# Patient Record
Sex: Male | Born: 1950 | Race: White | Hispanic: No | Marital: Married | Smoking: Former smoker
Health system: Southern US, Community
[De-identification: ages and names within clinical notes are randomized; demographics above are authoritative.]

## PROBLEM LIST (undated history)

## (undated) DIAGNOSIS — E782 Mixed hyperlipidemia: Secondary | ICD-10-CM

## (undated) DIAGNOSIS — I10 Essential (primary) hypertension: Secondary | ICD-10-CM

## (undated) DIAGNOSIS — N135 Crossing vessel and stricture of ureter without hydronephrosis: Secondary | ICD-10-CM

## (undated) DIAGNOSIS — I2510 Atherosclerotic heart disease of native coronary artery without angina pectoris: Secondary | ICD-10-CM

## (undated) DIAGNOSIS — E119 Type 2 diabetes mellitus without complications: Secondary | ICD-10-CM

## (undated) DIAGNOSIS — C61 Malignant neoplasm of prostate: Secondary | ICD-10-CM

## (undated) HISTORY — DX: Malignant neoplasm of prostate: C61

## (undated) HISTORY — PX: CARDIOVASCULAR STRESS TEST: SHX262

## (undated) HISTORY — DX: Mixed hyperlipidemia: E78.2

## (undated) HISTORY — DX: Atherosclerotic heart disease of native coronary artery without angina pectoris: I25.10

## (undated) HISTORY — DX: Type 2 diabetes mellitus without complications: E11.9

## (undated) HISTORY — PX: CARDIAC CATHETERIZATION: SHX172

---
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
## 2006-08-08 ENCOUNTER — Encounter (INDEPENDENT_AMBULATORY_CARE_PROVIDER_SITE_OTHER): Payer: Self-pay | Admitting: Internal Medicine

## 2006-08-08 ENCOUNTER — Ambulatory Visit: Payer: Self-pay | Admitting: Family Medicine

## 2006-08-08 DIAGNOSIS — R209 Unspecified disturbances of skin sensation: Secondary | ICD-10-CM | POA: Insufficient documentation

## 2006-08-18 ENCOUNTER — Encounter (INDEPENDENT_AMBULATORY_CARE_PROVIDER_SITE_OTHER): Payer: Self-pay | Admitting: Internal Medicine

## 2006-10-03 DIAGNOSIS — G5600 Carpal tunnel syndrome, unspecified upper limb: Secondary | ICD-10-CM | POA: Insufficient documentation

## 2006-10-12 ENCOUNTER — Encounter (INDEPENDENT_AMBULATORY_CARE_PROVIDER_SITE_OTHER): Payer: Self-pay | Admitting: Internal Medicine

## 2006-10-12 HISTORY — PX: CARPAL TUNNEL RELEASE: SHX101

## 2007-12-12 ENCOUNTER — Ambulatory Visit: Payer: Self-pay | Admitting: Family Medicine

## 2007-12-12 DIAGNOSIS — I10 Essential (primary) hypertension: Secondary | ICD-10-CM | POA: Insufficient documentation

## 2007-12-14 LAB — CONVERTED CEMR LAB
ALT: 33 units/L (ref 0–53)
AST: 22 units/L (ref 0–37)
Basophils Relative: 0.7 % (ref 0.0–3.0)
CO2: 29 meq/L (ref 19–32)
Calcium: 9.3 mg/dL (ref 8.4–10.5)
Chloride: 105 meq/L (ref 96–112)
Creatinine, Ser: 1.1 mg/dL (ref 0.4–1.5)
Direct LDL: 174.6 mg/dL
Eosinophils Relative: 4.8 % (ref 0.0–5.0)
Glucose, Bld: 127 mg/dL — ABNORMAL HIGH (ref 70–99)
HDL: 27.4 mg/dL — ABNORMAL LOW (ref >39.0)
Hemoglobin: 16.6 g/dL (ref 13.0–17.0)
Hgb A1c MFr Bld: 6.3 % — ABNORMAL HIGH (ref 4.6–6.0)
Lymphocytes Relative: 27.7 % (ref 12.0–46.0)
Monocytes Relative: 9.3 % (ref 3.0–12.0)
Neutro Abs: 3.9 10*3/uL (ref 1.4–7.7)
Neutrophils Relative %: 57.5 % (ref 43.0–77.0)
RBC: 5.37 M/uL (ref 4.22–5.81)
Total Bilirubin: 0.8 mg/dL (ref 0.3–1.2)
Total CHOL/HDL Ratio: 10.1
Total Protein: 6.4 g/dL (ref 6.0–8.3)
VLDL: 47 mg/dL — ABNORMAL HIGH (ref 0–40)
WBC: 6.7 10*3/uL (ref 4.5–10.5)

## 2007-12-15 ENCOUNTER — Ambulatory Visit: Payer: Self-pay | Admitting: Family Medicine

## 2007-12-15 DIAGNOSIS — E119 Type 2 diabetes mellitus without complications: Secondary | ICD-10-CM | POA: Insufficient documentation

## 2007-12-15 DIAGNOSIS — E782 Mixed hyperlipidemia: Secondary | ICD-10-CM | POA: Insufficient documentation

## 2007-12-15 DIAGNOSIS — C61 Malignant neoplasm of prostate: Secondary | ICD-10-CM

## 2008-01-29 ENCOUNTER — Ambulatory Visit: Payer: Self-pay | Admitting: Internal Medicine

## 2008-01-31 ENCOUNTER — Telehealth (INDEPENDENT_AMBULATORY_CARE_PROVIDER_SITE_OTHER): Payer: Self-pay | Admitting: Internal Medicine

## 2008-02-01 LAB — CONVERTED CEMR LAB
AST: 27 units/L (ref 0–37)
Cholesterol: 213 mg/dL (ref 0–200)
Direct LDL: 127 mg/dL
PSA: 9.65 ng/mL — ABNORMAL HIGH (ref 0.10–4.00)
Total CHOL/HDL Ratio: 5.2
Triglycerides: 205 mg/dL (ref 0–149)
VLDL: 41 mg/dL — ABNORMAL HIGH (ref 0–40)

## 2008-02-06 ENCOUNTER — Ambulatory Visit: Payer: Self-pay | Admitting: Family Medicine

## 2008-03-05 ENCOUNTER — Ambulatory Visit: Payer: Self-pay | Admitting: Family Medicine

## 2008-03-06 ENCOUNTER — Encounter (INDEPENDENT_AMBULATORY_CARE_PROVIDER_SITE_OTHER): Payer: Self-pay | Admitting: Internal Medicine

## 2008-03-07 LAB — CONVERTED CEMR LAB
Creatinine,U: 67.8 mg/dL
HDL: 38 mg/dL — ABNORMAL LOW (ref >39.0)
Microalb, Ur: 0.5 mg/dL (ref 0.0–1.9)
Total CHOL/HDL Ratio: 4.4
VLDL: 18 mg/dL (ref 0–40)

## 2008-03-14 ENCOUNTER — Encounter (INDEPENDENT_AMBULATORY_CARE_PROVIDER_SITE_OTHER): Payer: Self-pay | Admitting: Internal Medicine

## 2008-04-25 ENCOUNTER — Ambulatory Visit: Payer: Self-pay | Admitting: Family Medicine

## 2008-04-25 LAB — CONVERTED CEMR LAB
ALT: 34 units/L (ref 0–53)
HDL: 37.1 mg/dL — ABNORMAL LOW (ref >39.00)
LDL Cholesterol: 130 mg/dL — ABNORMAL HIGH (ref 0–99)
Total CHOL/HDL Ratio: 5
Triglycerides: 85 mg/dL (ref 0.0–149.0)

## 2008-04-26 ENCOUNTER — Telehealth (INDEPENDENT_AMBULATORY_CARE_PROVIDER_SITE_OTHER): Payer: Self-pay | Admitting: *Deleted

## 2008-05-09 ENCOUNTER — Ambulatory Visit: Payer: Self-pay | Admitting: Family Medicine

## 2008-05-10 LAB — CONVERTED CEMR LAB
LDL Cholesterol: 131 mg/dL — ABNORMAL HIGH (ref 0–99)
Total CHOL/HDL Ratio: 5

## 2008-06-05 ENCOUNTER — Ambulatory Visit: Payer: Self-pay | Admitting: Family Medicine

## 2008-06-05 DIAGNOSIS — L723 Sebaceous cyst: Secondary | ICD-10-CM

## 2008-06-06 ENCOUNTER — Ambulatory Visit: Payer: Self-pay | Admitting: Family Medicine

## 2008-06-10 ENCOUNTER — Ambulatory Visit: Payer: Self-pay | Admitting: Family Medicine

## 2008-08-06 ENCOUNTER — Ambulatory Visit: Payer: Self-pay | Admitting: Family Medicine

## 2008-08-07 LAB — CONVERTED CEMR LAB
ALT: 37 units/L (ref 0–53)
Cholesterol: 194 mg/dL (ref 0–200)
HDL: 34.3 mg/dL — ABNORMAL LOW (ref >39.00)
Triglycerides: 115 mg/dL (ref 0.0–149.0)
VLDL: 23 mg/dL (ref 0.0–40.0)

## 2008-09-04 ENCOUNTER — Ambulatory Visit: Payer: Self-pay | Admitting: Family Medicine

## 2008-09-04 LAB — HM DIABETES FOOT EXAM

## 2008-10-08 ENCOUNTER — Ambulatory Visit: Payer: Self-pay | Admitting: Family Medicine

## 2008-10-11 ENCOUNTER — Ambulatory Visit: Payer: Self-pay | Admitting: Family Medicine

## 2008-10-17 LAB — CONVERTED CEMR LAB
HDL: 38.7 mg/dL — ABNORMAL LOW (ref >39.00)
Hgb A1c MFr Bld: 6.2 % (ref 4.6–6.5)
LDL Cholesterol: 119 mg/dL — ABNORMAL HIGH (ref 0–99)
Total CHOL/HDL Ratio: 5
Triglycerides: 197 mg/dL — ABNORMAL HIGH (ref 0.0–149.0)
VLDL: 39.4 mg/dL (ref 0.0–40.0)

## 2010-02-01 DIAGNOSIS — C61 Malignant neoplasm of prostate: Secondary | ICD-10-CM

## 2010-02-01 HISTORY — DX: Malignant neoplasm of prostate: C61

## 2010-12-14 ENCOUNTER — Encounter: Payer: Self-pay | Admitting: Family Medicine

## 2010-12-15 ENCOUNTER — Ambulatory Visit (INDEPENDENT_AMBULATORY_CARE_PROVIDER_SITE_OTHER)
Admission: RE | Admit: 2010-12-15 | Discharge: 2010-12-15 | Disposition: A | Discharge status: Home / Self Care | Payer: Managed Care, Other (non HMO) | Source: Ambulatory Visit | Attending: Family Medicine | Admitting: Family Medicine

## 2010-12-15 ENCOUNTER — Ambulatory Visit (INDEPENDENT_AMBULATORY_CARE_PROVIDER_SITE_OTHER): Payer: Managed Care, Other (non HMO) | Admitting: Family Medicine

## 2010-12-15 ENCOUNTER — Encounter: Payer: Self-pay | Admitting: Family Medicine

## 2010-12-15 VITALS — BP 166/90 | HR 76 | Temp 98.2°F | Ht 72.0 in | Wt 207.0 lb

## 2010-12-15 DIAGNOSIS — R972 Elevated prostate specific antigen [PSA]: Secondary | ICD-10-CM

## 2010-12-15 DIAGNOSIS — M545 Low back pain, unspecified: Secondary | ICD-10-CM

## 2010-12-15 DIAGNOSIS — R7309 Other abnormal glucose: Secondary | ICD-10-CM

## 2010-12-15 DIAGNOSIS — E782 Mixed hyperlipidemia: Secondary | ICD-10-CM

## 2010-12-15 DIAGNOSIS — M5136 Other intervertebral disc degeneration, lumbar region: Secondary | ICD-10-CM | POA: Insufficient documentation

## 2010-12-15 DIAGNOSIS — I10 Essential (primary) hypertension: Secondary | ICD-10-CM

## 2010-12-15 DIAGNOSIS — R7303 Prediabetes: Secondary | ICD-10-CM

## 2010-12-15 LAB — COMPREHENSIVE METABOLIC PANEL
ALT: 21 U/L (ref 0–53)
AST: 21 U/L (ref 0–37)
CO2: 32 mEq/L (ref 19–32)
Calcium: 9.2 mg/dL (ref 8.4–10.5)
Chloride: 102 mEq/L (ref 96–112)
Creatinine, Ser: 1.6 mg/dL — ABNORMAL HIGH (ref 0.4–1.5)
GFR: 46.35 mL/min — ABNORMAL LOW (ref >60.00)
Sodium: 140 mEq/L (ref 135–145)
Total Bilirubin: 0.8 mg/dL (ref 0.3–1.2)
Total Protein: 6.5 g/dL (ref 6.0–8.3)

## 2010-12-15 LAB — HEMOGLOBIN A1C: Hgb A1c MFr Bld: 6.5 % (ref 4.6–6.5)

## 2010-12-15 LAB — LIPID PANEL: Total CHOL/HDL Ratio: 7

## 2010-12-15 MED ORDER — CYCLOBENZAPRINE HCL 5 MG PO TABS: 5.0000 mg | ORAL_TABLET | Freq: Three times a day (TID) | ORAL | Status: DC | PRN

## 2010-12-15 MED ORDER — LISINOPRIL 20 MG PO TABS: 20.0000 mg | ORAL_TABLET | Freq: Every day | ORAL | Status: DC

## 2010-12-15 MED ORDER — NAPROXEN 500 MG PO TABS: ORAL_TABLET | ORAL | Status: DC

## 2010-12-15 NOTE — Assessment & Plan Note (Signed)
actually prediabetes based on previous blood work. Recheck A1c today.

## 2010-12-15 NOTE — Patient Instructions (Addendum)
Xray today.   For back - sounds like lumbar strain.  Treat with naprosyn twice daily with food for 1 week then as needed.  May use flexeril as needed for muscle spasm (may make you sleepy) I have checked blood work today.  (prostate, kidneys, A1c for diabetes, cholesterol). Restart lisinopril for blood pressure. Return at your convenience for physical.

## 2010-12-15 NOTE — Assessment & Plan Note (Signed)
concerning in setting of recent weight loss (although small amt). Discussed improtance of further evaluation of this abnormal elevation. Pt continues to decline prostate biopsy, aware cancer may be causing elevation.  Would like to start with PSA check today. Advised to return for physical. Wt Readings from Last 3 Encounters:  12/15/10 207 lb (93.895 kg)  10/11/08 211 lb (95.709 kg)  09/04/08 209 lb 4 oz (94.915 kg)

## 2010-12-15 NOTE — Progress Notes (Signed)
  Subjective:    Patient ID: Francisco Dunn, male    DOB: 1950/03/01, 60 y.o.   MRN: [id number]  HPI CC: re establish  Here for acute visit today - 2 wk h/o left lower back ache that initially felt like muscle strain, but has remained.  Has migrated across lower back to right side now.  Has taken aspirin and ointment for pain.  Denies inciting trauma/injury but has been twisting/bending more than normal recently 2/2 project at work.  No fevers/chills, nausea, vomiting, diarrhea, bowel/bladder incontinence, weakness or numbness, shooting pains down legs.  Has also lost 4-6 lbs in last 2 wks, not trying.  Off meds for 1 1/2 yrs.  Prescriptions ran out, has not come for f/u.  Not here since 10/2008.  HTN - was on lisinopril.  No HA, vision changes, CP/tightness, SOB, leg swelling.  Rarely keeps track of bp at home.  130/80s.  Prior to diagnosis was feeling off, having HA.  HLD - was on lipitor in past, stopped and did not refill. Lab Results  Component Value Date   LDLCALC 119* 10/08/2008   T2DM - never on meds.  Dx around 2010.  Always diet controlled.  Actually prediabetes in past. Lab Results  Component Value Date   HGBA1C 6.2 10/08/2008   Elevated PSA - up to 9s in past.  Saw urologist for initial visit, did not go back for biopsy 2/2 concerns.  Nocturia x1, no weakness in stream or hesitance. Lab Results  Component Value Date   PSA 9.65* 01/29/2008   PSA 9.99* 12/12/2007   Preventative: Last CPE 2010. Colon screening - has never had colonoscopy.  Denies blood in stool. Tetanus shot - unsure.  >10 yrs ago.  Declines today. Flu shot - declines.  Review of Systems Per HPI    Objective:   Physical Exam  Nursing note and vitals reviewed. Constitutional: He appears well-developed and well-nourished. No distress.  HENT:  Head: Normocephalic and atraumatic.  Mouth/Throat: Oropharynx is clear and moist. No oropharyngeal exudate.  Eyes: Conjunctivae and EOM are normal. Pupils are equal,  round, and reactive to light. No scleral icterus.  Neck: Normal range of motion. Neck supple.  Cardiovascular: Normal rate, regular rhythm, normal heart sounds and intact distal pulses.   No murmur heard. Pulmonary/Chest: Effort normal and breath sounds normal. No respiratory distress. He has no wheezes. He has no rales.  Abdominal: Soft. Bowel sounds are normal. He exhibits no distension. There is no tenderness. There is no rebound and no guarding.  Musculoskeletal: He exhibits no edema.       FROM spine.   No midline spine tenderness, no paraspinous mm tenderness at lumbar region. Neg SLR No pain at SIJ or GTB or sciatic notch. No pain with int/ext rotation at hip  Lymphadenopathy:    He has no cervical adenopathy.  Neurological: He is alert. He has normal reflexes. He exhibits normal muscle tone. Coordination normal.  Skin: Skin is warm and dry. No rash noted.  Psychiatric: He has a normal mood and affect.      Assessment & Plan:

## 2010-12-15 NOTE — Assessment & Plan Note (Signed)
Elevated today - restart lisinopril. Blood work today (fasting).

## 2010-12-15 NOTE — Assessment & Plan Note (Signed)
Concern given elevated PSA in past, checked lumbar film.  No bone lesions on my read, will await radiologist read. Exam benign today. Treat as lumbar strain with NSAID, flexeril, stretching exercises from [facility] pt advisor on low back pain. update if not improving.

## 2010-12-15 NOTE — Assessment & Plan Note (Signed)
Check blood work today - fasting. Did not start lipitor - await current results.

## 2010-12-16 ENCOUNTER — Telehealth: Payer: Self-pay | Admitting: Family Medicine

## 2010-12-16 DIAGNOSIS — I10 Essential (primary) hypertension: Secondary | ICD-10-CM

## 2010-12-16 DIAGNOSIS — R972 Elevated prostate specific antigen [PSA]: Secondary | ICD-10-CM

## 2010-12-16 DIAGNOSIS — N289 Disorder of kidney and ureter, unspecified: Secondary | ICD-10-CM

## 2010-12-16 NOTE — Telephone Encounter (Signed)
Patient notified. Repeat labs scheduled. Patient has seen Dr. Wanda Plump at [facility] in [place] and said he prefers to go back there. Forwarded to [facility] for referral .

## 2010-12-16 NOTE — Telephone Encounter (Signed)
Message left for patient to return my call.  

## 2010-12-16 NOTE — Telephone Encounter (Signed)
please call pt: PSA test has returned much higher.  He needs to follow up with urology, likely needs biopsy.  I have placed referral in chart. Kidney function was elevated compared to 2 years ago.  Ensure getting plenty of fluid. Please notify I'd like him to return in 2 weeks to recheck kidney function on lisinopril cholesterol levels were elevated, but I want [place] to evaluate abnormal prostate test first. A1c returned a bit higher than in past, but still ok range - will need to continue to monitor.

## 2010-12-27 ENCOUNTER — Encounter: Payer: Self-pay | Admitting: Family Medicine

## 2010-12-27 NOTE — Telephone Encounter (Signed)
Received consult note from urology, suspicious for malignancy, biopsy to be scheduled. Can we fax labwork to urology (PSA)?  Thanks.

## 2010-12-28 NOTE — Telephone Encounter (Signed)
Labs faxed to urology as directed.

## 2010-12-30 ENCOUNTER — Other Ambulatory Visit (INDEPENDENT_AMBULATORY_CARE_PROVIDER_SITE_OTHER): Payer: Managed Care, Other (non HMO)

## 2010-12-30 DIAGNOSIS — N289 Disorder of kidney and ureter, unspecified: Secondary | ICD-10-CM

## 2010-12-30 DIAGNOSIS — I10 Essential (primary) hypertension: Secondary | ICD-10-CM

## 2010-12-30 LAB — RENAL FUNCTION PANEL
CO2: 26 mEq/L (ref 19–32)
Calcium: 9.1 mg/dL (ref 8.4–10.5)
Chloride: 105 mEq/L (ref 96–112)
GFR: 42.39 mL/min — ABNORMAL LOW (ref >60.00)
Sodium: 139 mEq/L (ref 135–145)

## 2011-01-01 ENCOUNTER — Telehealth: Payer: Self-pay | Admitting: Family Medicine

## 2011-01-01 NOTE — Telephone Encounter (Signed)
Will discuss at f/u visit next week.

## 2011-01-06 ENCOUNTER — Ambulatory Visit (INDEPENDENT_AMBULATORY_CARE_PROVIDER_SITE_OTHER): Payer: Managed Care, Other (non HMO) | Admitting: Family Medicine

## 2011-01-06 ENCOUNTER — Encounter: Payer: Self-pay | Admitting: Family Medicine

## 2011-01-06 VITALS — BP 138/80 | HR 72 | Temp 98.0°F | Wt 204.0 lb

## 2011-01-06 DIAGNOSIS — R7303 Prediabetes: Secondary | ICD-10-CM

## 2011-01-06 DIAGNOSIS — M545 Low back pain, unspecified: Secondary | ICD-10-CM

## 2011-01-06 DIAGNOSIS — R238 Other skin changes: Secondary | ICD-10-CM

## 2011-01-06 DIAGNOSIS — C61 Malignant neoplasm of prostate: Secondary | ICD-10-CM

## 2011-01-06 DIAGNOSIS — Z0000 Encounter for general adult medical examination without abnormal findings: Secondary | ICD-10-CM

## 2011-01-06 DIAGNOSIS — N289 Disorder of kidney and ureter, unspecified: Secondary | ICD-10-CM | POA: Insufficient documentation

## 2011-01-06 DIAGNOSIS — I10 Essential (primary) hypertension: Secondary | ICD-10-CM

## 2011-01-06 DIAGNOSIS — Z23 Encounter for immunization: Secondary | ICD-10-CM

## 2011-01-06 DIAGNOSIS — R7309 Other abnormal glucose: Secondary | ICD-10-CM

## 2011-01-06 MED ORDER — AMLODIPINE BESYLATE 5 MG PO TABS: 5.0000 mg | ORAL_TABLET | Freq: Every day | ORAL | Status: DC

## 2011-01-06 MED ORDER — TRAMADOL HCL 50 MG PO TABS: 50.0000 mg | ORAL_TABLET | Freq: Two times a day (BID) | ORAL | Status: AC | PRN

## 2011-01-06 NOTE — Assessment & Plan Note (Signed)
Not improved with NSAID/flexeril. Xray 12/2010 negative for bone lesions, with DDD. Start tramadol prn pain, stop NSAIDs.

## 2011-01-06 NOTE — Assessment & Plan Note (Signed)
Improved control on restarting lisinopril, however given renal insufficiency, will change to amlodipine and reassess.

## 2011-01-06 NOTE — Progress Notes (Signed)
Subjective:    Patient ID: Francisco Dunn, male    DOB: Dec 08, 1950, 60 y.o.   MRN: [id number]  HPI CC: CPE today  Elevated PSA found last visit - eval by urology Dr. Reuel Boom, prostate US/biopsy last week for concern for prostate cancer with PSA 56.  Prostate biopsy last week, returned for f/u yesterday.  Friday going for bone scan.  CT scan on Monday.  To f/u with Dr. Rosann Auerbach on 01/18/2011.  Noted nocturia since biopsy.  Happy with Dr. Reuel Boom.  Back continues to bother him, no help with naprosyn/flexeril.  Worried about spread of cancer to back.  Pain/pressure worse with sitting.  Normal stools but feels urge to go.  Denies constipation/diarrhea.  Lumbar xray negative for blastic lesions 12/2010.  Has noticed easy bruising mostly on hands.  Wonders if due to ASA/NSAID use.  BP Readings from Last 3 Encounters:  01/06/11 138/80  12/15/10 166/90  10/11/08 120/80   Wt Readings from Last 3 Encounters:  01/06/11 204 lb (92.534 kg)  12/15/10 207 lb (93.895 kg)  10/11/08 211 lb (95.709 kg)   Preventative: Colon screening - has never had colonoscopy. Denies blood in stool. Prostate - see above. Flu shot - declines. PNA shot - declines. Tetanus - would like today  Medications and allergies reviewed and updated in chart.  Past histories reviewed and updated if relevant as below. Patient Active Problem List  Diagnoses  . Prediabetes  . HYPERLIPIDEMIA, MIXED  . CARPAL TUNNEL SYNDROME, RIGHT  . HYPERTENSION  . SEBACEOUS CYST  . NUMBNESS, HAND  . PSA, INCREASED  . Lower back pain   Past Medical History  Diagnosis Date  . Hand numbness   . Carpal tunnel syndrome     right  . Prediabetes   . Mixed hyperlipidemia   . HTN (hypertension)   . Prostate cancer     Dr. Garner Nash  . Sebaceous cyst    Past Surgical History  Procedure Date  . Tonsillectomy 1960's  . Wisdom tooth extraction 1968  . Carpal tunnel release 10/12/06    Right   History  Substance Use Topics  . Smoking  status: Current Some Day Smoker    Types: Cigarettes  . Smokeless tobacco: Never Used  . Alcohol Use: No   Family History  Problem Relation Age of Onset  . Other Mother     PTCA  . Healthy Father   . Thyroid disease Brother   . Heart attack Maternal Grandfather   . Heart attack Paternal Grandfather   . Heart attack Paternal Grandmother   . Heart attack Paternal Aunt   . Diabetes      Both sides  . Prostate cancer Neg Hx   . Colon cancer Neg Hx   . Breast cancer Neg Hx    No Known Allergies Current Outpatient Prescriptions on File Prior to Visit  Medication Sig Dispense Refill  . Ascorbic Acid (VITAMIN C PO) Take 1 tablet by mouth daily.        Marland Kitchen atorvastatin (LIPITOR) 40 MG tablet Take 40 mg by mouth daily.        Marland Kitchen GARLIC PO Take by mouth daily.        . Multiple Vitamin (MULTIVITAMIN) tablet Take 1 tablet by mouth daily.        Marland Kitchen POTASSIUM GLUCONATE PO Take by mouth daily.        . Pyridoxine HCl (VITAMIN B-6 PO) Take 1 tablet by mouth daily.        Marland Kitchen  aspirin EC 81 MG tablet Take 81 mg by mouth daily.        . cyclobenzaprine (FLEXERIL) 5 MG tablet Take 1 tablet (5 mg total) by mouth every 8 (eight) hours as needed for muscle spasms.  30 tablet  0   Review of Systems  Constitutional: Positive for unexpected weight change (3lb weight loss). Negative for fever, chills, activity change, appetite change and fatigue.  HENT: Negative for hearing loss and neck pain.   Eyes: Negative for visual disturbance.  Respiratory: Negative for cough, chest tightness, shortness of breath and wheezing.   Cardiovascular: Negative for chest pain, palpitations and leg swelling.  Gastrointestinal: Negative for nausea, vomiting, abdominal pain, diarrhea, constipation, blood in stool and abdominal distention.  Genitourinary: Negative for hematuria and difficulty urinating.  Musculoskeletal: Positive for back pain. Negative for myalgias and arthralgias.  Skin: Negative for rash.  Neurological:  Negative for dizziness, seizures, syncope and headaches.  Hematological: Bruises/bleeds easily.  Psychiatric/Behavioral: Negative for dysphoric mood. The patient is not nervous/anxious.        Objective:   Physical Exam  Nursing note and vitals reviewed. Constitutional: He is oriented to person, place, and time. He appears well-developed and well-nourished. No distress.  HENT:  Head: Normocephalic and atraumatic.  Right Ear: External ear normal.  Left Ear: External ear normal.  Nose: Nose normal.  Mouth/Throat: Oropharynx is clear and moist. No oropharyngeal exudate.  Eyes: Conjunctivae and EOM are normal. Pupils are equal, round, and reactive to light. No scleral icterus.  Neck: Normal range of motion. Neck supple. No thyromegaly present.  Cardiovascular: Normal rate, regular rhythm, normal heart sounds and intact distal pulses.   No murmur heard. Pulses:      Radial pulses are 2+ on the right side, and 2+ on the left side.  Pulmonary/Chest: Effort normal and breath sounds normal. No respiratory distress. He has no wheezes. He has no rales.  Abdominal: Soft. Bowel sounds are normal. He exhibits no distension and no mass. There is no tenderness. There is no rebound and no guarding.  Musculoskeletal: Normal range of motion.  Lymphadenopathy:    He has no cervical adenopathy.  Neurological: He is alert and oriented to person, place, and time.       CN grossly intact, station and gait intact  Skin: Skin is warm and dry. No rash noted.  Psychiatric: He has a normal mood and affect. His behavior is normal. Judgment and thought content normal.       Assessment & Plan:

## 2011-01-06 NOTE — Assessment & Plan Note (Signed)
Reviewed preventative protocols today Pt continues to decline flu, declines pneumovax. Requests Tdap today, provided. Postponing colon screening for now.

## 2011-01-06 NOTE — Assessment & Plan Note (Addendum)
Bump in Cr from 1.6 to 1.8 (unsure how this was in last 2 years though) in setting of recent NSAID and lisinopril use, but also new prostate cancer. Will stop NSAID, change ACEI to amlodipine and recheck in [redacted] wks along with coags. Endorses emptying fine when voiding. Pending CT scan.

## 2011-01-06 NOTE — Assessment & Plan Note (Signed)
Appreciate Dr. Rosann Auerbach care of pt. Pending bone scan and CT scan in next few weeks.

## 2011-01-06 NOTE — Patient Instructions (Addendum)
Stop lisinopril, start amlodipine once daily for blood pressure (start at 5 mg). Stop naprosyn, start tramadol for back pain as needed. Tdap today (tetanus and pertussis). Keep thinking about flu shot.  I recommend it. return in 3 weeks for blood work. I will fax note to Dr. Garner Nash

## 2011-01-06 NOTE — Assessment & Plan Note (Addendum)
A1c 6.5% - diet controlled diabetes.  Discussed limiting sugar/carbs.

## 2011-01-11 ENCOUNTER — Ambulatory Visit: Payer: Self-pay | Admitting: Specialist

## 2011-01-18 ENCOUNTER — Ambulatory Visit: Payer: Self-pay | Admitting: Radiation Oncology

## 2011-01-19 ENCOUNTER — Encounter: Payer: Self-pay | Admitting: Family Medicine

## 2011-01-27 ENCOUNTER — Other Ambulatory Visit (INDEPENDENT_AMBULATORY_CARE_PROVIDER_SITE_OTHER): Payer: Managed Care, Other (non HMO)

## 2011-01-27 DIAGNOSIS — R238 Other skin changes: Secondary | ICD-10-CM

## 2011-01-27 DIAGNOSIS — N289 Disorder of kidney and ureter, unspecified: Secondary | ICD-10-CM

## 2011-01-27 DIAGNOSIS — I10 Essential (primary) hypertension: Secondary | ICD-10-CM

## 2011-01-27 LAB — BASIC METABOLIC PANEL
CO2: 29 mEq/L (ref 19–32)
Chloride: 106 mEq/L (ref 96–112)
Glucose, Bld: 112 mg/dL — ABNORMAL HIGH (ref 70–99)
Sodium: 142 mEq/L (ref 135–145)

## 2011-01-27 LAB — PROTIME-INR: INR: 0.9 ratio (ref 0.8–1.0)

## 2011-01-29 ENCOUNTER — Ambulatory Visit: Payer: Self-pay | Admitting: Radiation Oncology

## 2011-02-02 ENCOUNTER — Ambulatory Visit: Payer: Self-pay | Admitting: Radiation Oncology

## 2011-02-04 LAB — CBC CANCER CENTER
Basophil #: 0 x10 3/mm (ref 0.0–0.1)
Basophil %: 0.3 %
Eosinophil %: 2.7 %
HCT: 43.5 % (ref 40.0–52.0)
Lymphocyte #: 1.4 x10 3/mm (ref 1.0–3.6)
MCH: 30 pg (ref 26.0–34.0)
MCV: 89 fL (ref 80–100)
Monocyte %: 8.8 %
Neutrophil #: 5.8 x10 3/mm (ref 1.4–6.5)
Neutrophil %: 70.6 %
Platelet: 232 x10 3/mm (ref 150–440)
RBC: 4.91 10*6/uL (ref 4.40–5.90)
WBC: 8.2 x10 3/mm (ref 3.8–10.6)

## 2011-02-04 LAB — COMPREHENSIVE METABOLIC PANEL
Albumin: 4 g/dL (ref 3.4–5.0)
Anion Gap: 8 (ref 7–16)
BUN: 23 mg/dL — ABNORMAL HIGH (ref 7–18)
Bilirubin,Total: 0.5 mg/dL (ref 0.2–1.0)
Chloride: 105 mmol/L (ref 98–107)
Creatinine: 1.6 mg/dL — ABNORMAL HIGH (ref 0.60–1.30)
EGFR (African American): 57 — ABNORMAL LOW
Glucose: 104 mg/dL — ABNORMAL HIGH (ref 65–99)
Potassium: 4.4 mmol/L (ref 3.5–5.1)
SGOT(AST): 21 U/L (ref 15–37)
Sodium: 142 mmol/L (ref 136–145)
Total Protein: 7.3 g/dL (ref 6.4–8.2)

## 2011-02-05 LAB — PSA: PSA: 73.7 ng/mL — ABNORMAL HIGH (ref 0.0–4.0)

## 2011-03-05 ENCOUNTER — Ambulatory Visit: Payer: Self-pay | Admitting: Radiation Oncology

## 2011-03-05 ENCOUNTER — Telehealth: Payer: Self-pay | Admitting: Family Medicine

## 2011-03-05 MED ORDER — AMLODIPINE BESYLATE 10 MG PO TABS: 10.0000 mg | ORAL_TABLET | Freq: Every day | ORAL | Status: DC

## 2011-03-05 NOTE — Telephone Encounter (Signed)
How high is blood pressure going?  What hormone is he taking?  May be contributing. Would like to increase amlodipine to 10mg  daily.  Update [place] if not improving after this. May double up on med until runs out, sent in higher dose.

## 2011-03-05 NOTE — Telephone Encounter (Signed)
The patient's blood pressure remains elevated after starting cancer treatments at the end of 12/12.  He is on a hormone injection, doesn't know if that has anything to do with it.  Please advise.

## 2011-03-08 NOTE — Telephone Encounter (Signed)
Agree. Ensure holding lisinopril.  I just want him on amlodipine 10mg  daily.

## 2011-03-08 NOTE — Telephone Encounter (Signed)
Spoke with patient. His last BP was 156/90, which is the highest it has been. The hormone is Lupron. Apparently he hasn't been taking the amlodipine. He went back to lisinopril on his own because he didn't feel that the amlodipine was working. I advised he needed to call us before doing that due to possible side effects and monitoring kidney function. He will restart the amlodipine and call back in about 2 weeks with an update.

## 2011-03-09 NOTE — Telephone Encounter (Signed)
Message left advising patient only to take Amlodipine. Advised to monitor BP and call with an update in 2 weeks or sooner if problems/questions.

## 2011-04-15 ENCOUNTER — Ambulatory Visit: Payer: Self-pay | Admitting: Oncology

## 2011-04-15 LAB — CBC CANCER CENTER
Basophil #: 0 x10 3/mm (ref 0.0–0.1)
Eosinophil #: 0.5 x10 3/mm (ref 0.0–0.7)
Eosinophil %: 5 %
Lymphocyte #: 1.5 x10 3/mm (ref 1.0–3.6)
MCH: 30.1 pg (ref 26.0–34.0)
MCV: 89 fL (ref 80–100)
Monocyte #: 1.1 x10 3/mm — ABNORMAL HIGH (ref 0.0–0.7)
Neutrophil %: 68.6 %
Platelet: 215 x10 3/mm (ref 150–440)
RDW: 13.6 % (ref 11.5–14.5)
WBC: 10 x10 3/mm (ref 3.8–10.6)

## 2011-04-15 LAB — COMPREHENSIVE METABOLIC PANEL
Albumin: 3.5 g/dL (ref 3.4–5.0)
Alkaline Phosphatase: 99 U/L (ref 50–136)
Anion Gap: 6 — ABNORMAL LOW (ref 7–16)
BUN: 25 mg/dL — ABNORMAL HIGH (ref 7–18)
Calcium, Total: 8.7 mg/dL (ref 8.5–10.1)
Creatinine: 1.42 mg/dL — ABNORMAL HIGH (ref 0.60–1.30)
Glucose: 137 mg/dL — ABNORMAL HIGH (ref 65–99)
Potassium: 4.5 mmol/L (ref 3.5–5.1)
SGOT(AST): 22 U/L (ref 15–37)
SGPT (ALT): 37 U/L
Sodium: 141 mmol/L (ref 136–145)

## 2011-05-03 ENCOUNTER — Ambulatory Visit: Payer: Self-pay | Admitting: Oncology

## 2011-05-27 LAB — COMPREHENSIVE METABOLIC PANEL
Albumin: 3.8 g/dL (ref 3.4–5.0)
Alkaline Phosphatase: 100 U/L (ref 50–136)
Anion Gap: 9 (ref 7–16)
Co2: 29 mmol/L (ref 21–32)
Creatinine: 1.26 mg/dL (ref 0.60–1.30)
Glucose: 144 mg/dL — ABNORMAL HIGH (ref 65–99)
Potassium: 4.1 mmol/L (ref 3.5–5.1)
SGPT (ALT): 44 U/L
Sodium: 142 mmol/L (ref 136–145)

## 2011-05-27 LAB — CBC CANCER CENTER
Basophil %: 1 %
Eosinophil #: 0.4 x10 3/mm (ref 0.0–0.7)
Eosinophil %: 8.1 %
HCT: 41.4 % (ref 40.0–52.0)
Lymphocyte #: 1.5 x10 3/mm (ref 1.0–3.6)
Lymphocyte %: 27.2 %
MCH: 30 pg (ref 26.0–34.0)
MCV: 90 fL (ref 80–100)
Monocyte %: 14.6 %
Neutrophil #: 2.7 x10 3/mm (ref 1.4–6.5)
Neutrophil %: 49.1 %
RBC: 4.58 10*6/uL (ref 4.40–5.90)
WBC: 5.4 x10 3/mm (ref 3.8–10.6)

## 2011-06-02 ENCOUNTER — Ambulatory Visit: Payer: Self-pay | Admitting: Oncology

## 2011-06-25 LAB — PSA: PSA: 5.1 ng/mL — ABNORMAL HIGH (ref 0.0–4.0)

## 2011-07-03 ENCOUNTER — Ambulatory Visit: Payer: Self-pay

## 2011-07-03 ENCOUNTER — Ambulatory Visit: Payer: Self-pay | Admitting: Oncology

## 2011-08-17 ENCOUNTER — Ambulatory Visit: Payer: Self-pay | Admitting: Oncology

## 2011-08-17 LAB — COMPREHENSIVE METABOLIC PANEL
Albumin: 3.5 g/dL (ref 3.4–5.0)
Alkaline Phosphatase: 87 U/L (ref 50–136)
Anion Gap: 10 (ref 7–16)
Calcium, Total: 9 mg/dL (ref 8.5–10.1)
Chloride: 103 mmol/L (ref 98–107)
EGFR (African American): >60
EGFR (Non-African Amer.): 53 — ABNORMAL LOW
Glucose: 140 mg/dL — ABNORMAL HIGH (ref 65–99)
Potassium: 4.6 mmol/L (ref 3.5–5.1)
SGOT(AST): 21 U/L (ref 15–37)

## 2011-08-17 LAB — CBC CANCER CENTER
Basophil %: 0.7 %
Eosinophil #: 0.3 x10 3/mm (ref 0.0–0.7)
HGB: 14.7 g/dL (ref 13.0–18.0)
Lymphocyte %: 21.2 %
MCH: 29.9 pg (ref 26.0–34.0)
MCHC: 34 g/dL (ref 32.0–36.0)
MCV: 88 fL (ref 80–100)
Monocyte #: 0.7 x10 3/mm (ref 0.2–1.0)
Neutrophil %: 61.8 %
RDW: 12.7 % (ref 11.5–14.5)

## 2011-08-18 LAB — PSA: PSA: 7.3 ng/mL — ABNORMAL HIGH (ref 0.0–4.0)

## 2011-08-30 ENCOUNTER — Telehealth: Payer: Self-pay | Admitting: *Deleted

## 2011-08-30 NOTE — Telephone Encounter (Signed)
Patient called and left message that his BP has been elevated (173/90) at the cancer center and he has been having slight headaches and was wondering what to do. He has been taking amlodipine 10 mg daily. Left message advising patient that he should come in for eval because he may need a change in meds. Advised to call and schedule appt this week.

## 2011-08-30 NOTE — Telephone Encounter (Signed)
Will see tomorrow

## 2011-08-31 ENCOUNTER — Ambulatory Visit (INDEPENDENT_AMBULATORY_CARE_PROVIDER_SITE_OTHER): Payer: Managed Care, Other (non HMO) | Admitting: Family Medicine

## 2011-08-31 ENCOUNTER — Encounter: Payer: Self-pay | Admitting: Family Medicine

## 2011-08-31 VITALS — BP 150/90 | HR 72 | Temp 98.2°F | Wt 227.8 lb

## 2011-08-31 DIAGNOSIS — R7309 Other abnormal glucose: Secondary | ICD-10-CM

## 2011-08-31 DIAGNOSIS — C61 Malignant neoplasm of prostate: Secondary | ICD-10-CM

## 2011-08-31 DIAGNOSIS — I10 Essential (primary) hypertension: Secondary | ICD-10-CM

## 2011-08-31 DIAGNOSIS — R7303 Prediabetes: Secondary | ICD-10-CM

## 2011-08-31 DIAGNOSIS — F17200 Nicotine dependence, unspecified, uncomplicated: Secondary | ICD-10-CM

## 2011-08-31 LAB — BASIC METABOLIC PANEL
BUN: 19 mg/dL (ref 6–23)
CO2: 27 mEq/L (ref 19–32)
Chloride: 105 mEq/L (ref 96–112)
Creatinine, Ser: 1.4 mg/dL (ref 0.4–1.5)

## 2011-08-31 MED ORDER — AMLODIPINE BESYLATE 10 MG PO TABS: 10.0000 mg | ORAL_TABLET | Freq: Every day | ORAL | Status: DC

## 2011-08-31 NOTE — Assessment & Plan Note (Signed)
Followed by uro and onc.

## 2011-08-31 NOTE — Progress Notes (Signed)
  Subjective:    Patient ID: Francisco Dunn, male    DOB: [birth year], 61 y.o.   MRN: [id number]  HPI CC: elevated BP  Lost job march 2013.  about to start new job  Seeing Dr. Altamese Herrings and [facility].  Last week was last lupron injection.  Gets every 3 months.  PSA returned elevated to 7.3 (h/o 73 in past).  HTN - compliant with amlodipine 10mg  daily.  At lupron injection appt, blood pressure was 173/90.  Has cuff at home, bp running 130-140/80-90s at home.  Had blood work done 08/17/2011.  Last time ACEI was started, Cr bumped from 1.4 to 1.8.  No HA, vision changes, CP/tightness, SOB, leg swelling.  Smoking - 1/2 ppd.  Trying to slowly wean himself off.  BP Readings from Last 3 Encounters:  08/31/11 150/90  01/06/11 138/80  12/15/10 166/90    DM - diet controlled. Doesn't check sugar at home.   Lab Results  Component Value Date   HGBA1C 6.5 12/15/2010   Wt Readings from Last 3 Encounters:  08/31/11 227 lb 12 oz (103.307 kg)  01/06/11 204 lb (92.534 kg)  12/15/10 207 lb (93.895 kg)  increased wt since starting lupron  Review of Systems Per HPI    Objective:   Physical Exam  Nursing note and vitals reviewed. Constitutional: He appears well-developed and well-nourished. No distress.  HENT:  Head: Normocephalic and atraumatic.  Mouth/Throat: Oropharynx is clear and moist. No oropharyngeal exudate.  Eyes: Conjunctivae and EOM are normal. Pupils are equal, round, and reactive to light. No scleral icterus.  Neck: Normal range of motion. Neck supple. Carotid bruit is not present.  Cardiovascular: Normal rate, regular rhythm, normal heart sounds and intact distal pulses.   No murmur heard. Pulmonary/Chest: Breath sounds normal. No respiratory distress. He has no wheezes. He has no rales.  Musculoskeletal: He exhibits no edema.  Skin: Skin is warm and dry. No rash noted.  Psychiatric: He has a normal mood and affect.       Assessment & Plan:

## 2011-08-31 NOTE — Assessment & Plan Note (Signed)
-  continue to encourage cessation.

## 2011-08-31 NOTE — Patient Instructions (Signed)
Blood work today. Checking A1c today. Depending on kidney function, will start you on new medication for blood pressure. Good to see you today, call us with questions.

## 2011-08-31 NOTE — Assessment & Plan Note (Signed)
Chronic. Stable on amlodipine.  recent elevated readings, although endorses home readings better controlled with systolic 130-140s. Will check blood work today, if Cr stable, start low dose ACEI (given h/o DM as well), with return in 1-2 wks to recehck Cr.  O/w consider HCTZ. Lab Results  Component Value Date   CREATININE 1.5 01/27/2011

## 2011-08-31 NOTE — Assessment & Plan Note (Signed)
Last A1c 6.5% = actually diabetes range.  Changed dx.  Recheck A1c today.  Concern given recent weight gain.  Discussed may recommend starting med if A1c continues to increase.

## 2011-09-01 ENCOUNTER — Other Ambulatory Visit: Payer: Self-pay | Admitting: Family Medicine

## 2011-09-01 DIAGNOSIS — I10 Essential (primary) hypertension: Secondary | ICD-10-CM

## 2011-09-01 MED ORDER — LISINOPRIL 5 MG PO TABS: 5.0000 mg | ORAL_TABLET | Freq: Every day | ORAL | Status: DC

## 2011-09-02 ENCOUNTER — Ambulatory Visit: Payer: Self-pay | Admitting: Oncology

## 2011-09-13 ENCOUNTER — Other Ambulatory Visit (INDEPENDENT_AMBULATORY_CARE_PROVIDER_SITE_OTHER): Payer: Managed Care, Other (non HMO)

## 2011-09-13 DIAGNOSIS — I10 Essential (primary) hypertension: Secondary | ICD-10-CM

## 2011-09-13 LAB — BASIC METABOLIC PANEL
BUN: 42 mg/dL — ABNORMAL HIGH (ref 6–23)
Calcium: 8.9 mg/dL (ref 8.4–10.5)
Creatinine, Ser: 2 mg/dL — ABNORMAL HIGH (ref 0.4–1.5)
GFR: 35.84 mL/min — ABNORMAL LOW (ref >60.00)
Glucose, Bld: 148 mg/dL — ABNORMAL HIGH (ref 70–99)

## 2011-09-14 ENCOUNTER — Other Ambulatory Visit: Payer: Self-pay | Admitting: Family Medicine

## 2011-09-14 ENCOUNTER — Encounter: Payer: Self-pay | Admitting: Family Medicine

## 2011-09-14 DIAGNOSIS — N289 Disorder of kidney and ureter, unspecified: Secondary | ICD-10-CM

## 2011-09-14 MED ORDER — HYDROCHLOROTHIAZIDE 12.5 MG PO TABS: 12.5000 mg | ORAL_TABLET | Freq: Every day | ORAL | Status: DC

## 2011-09-21 ENCOUNTER — Other Ambulatory Visit (INDEPENDENT_AMBULATORY_CARE_PROVIDER_SITE_OTHER): Payer: Managed Care, Other (non HMO)

## 2011-09-21 DIAGNOSIS — N289 Disorder of kidney and ureter, unspecified: Secondary | ICD-10-CM

## 2011-09-21 LAB — BASIC METABOLIC PANEL
CO2: 23 mEq/L (ref 19–32)
Calcium: 9 mg/dL (ref 8.4–10.5)
Creatinine, Ser: 3.5 mg/dL — ABNORMAL HIGH (ref 0.4–1.5)
GFR: 18.94 mL/min — ABNORMAL LOW (ref >60.00)

## 2011-09-23 ENCOUNTER — Inpatient Hospital Stay (HOSPITAL_COMMUNITY): Payer: Managed Care, Other (non HMO)

## 2011-09-23 ENCOUNTER — Inpatient Hospital Stay (HOSPITAL_COMMUNITY)
Admission: EM | Admit: 2011-09-23 | Discharge: 2011-09-25 | DRG: 661 | Disposition: A | Discharge status: Home / Self Care | Payer: Managed Care, Other (non HMO) | Attending: Internal Medicine | Admitting: Internal Medicine

## 2011-09-23 ENCOUNTER — Telehealth: Payer: Self-pay | Admitting: Family Medicine

## 2011-09-23 ENCOUNTER — Encounter (HOSPITAL_COMMUNITY): Payer: Self-pay

## 2011-09-23 DIAGNOSIS — Z79899 Other long term (current) drug therapy: Secondary | ICD-10-CM

## 2011-09-23 DIAGNOSIS — N1330 Unspecified hydronephrosis: Secondary | ICD-10-CM | POA: Diagnosis present

## 2011-09-23 DIAGNOSIS — C61 Malignant neoplasm of prostate: Secondary | ICD-10-CM | POA: Diagnosis present

## 2011-09-23 DIAGNOSIS — E782 Mixed hyperlipidemia: Secondary | ICD-10-CM | POA: Diagnosis present

## 2011-09-23 DIAGNOSIS — F17200 Nicotine dependence, unspecified, uncomplicated: Secondary | ICD-10-CM | POA: Diagnosis present

## 2011-09-23 DIAGNOSIS — I129 Hypertensive chronic kidney disease with stage 1 through stage 4 chronic kidney disease, or unspecified chronic kidney disease: Secondary | ICD-10-CM | POA: Diagnosis present

## 2011-09-23 DIAGNOSIS — I10 Essential (primary) hypertension: Secondary | ICD-10-CM | POA: Diagnosis present

## 2011-09-23 DIAGNOSIS — N179 Acute kidney failure, unspecified: Principal | ICD-10-CM | POA: Diagnosis present

## 2011-09-23 DIAGNOSIS — N289 Disorder of kidney and ureter, unspecified: Secondary | ICD-10-CM | POA: Diagnosis present

## 2011-09-23 DIAGNOSIS — E119 Type 2 diabetes mellitus without complications: Secondary | ICD-10-CM | POA: Diagnosis present

## 2011-09-23 DIAGNOSIS — N189 Chronic kidney disease, unspecified: Secondary | ICD-10-CM | POA: Diagnosis present

## 2011-09-23 LAB — CBC WITH DIFFERENTIAL/PLATELET
Basophils Absolute: 0 10*3/uL (ref 0.0–0.1)
Basophils Relative: 0 % (ref 0–1)
Eosinophils Absolute: 0.2 K/uL (ref 0.0–0.7)
Eosinophils Relative: 1 % (ref 0–5)
HCT: 40.1 % (ref 39.0–52.0)
Hemoglobin: 14.1 g/dL (ref 13.0–17.0)
Lymphocytes Relative: 16 % (ref 12–46)
Lymphs Abs: 1.8 K/uL (ref 0.7–4.0)
MCH: 29.6 pg (ref 26.0–34.0)
MCHC: 35.2 g/dL (ref 30.0–36.0)
MCV: 84.2 fL (ref 78.0–100.0)
Monocytes Absolute: 1.3 10*3/uL — ABNORMAL HIGH (ref 0.1–1.0)
Monocytes Relative: 11 % (ref 3–12)
Neutro Abs: 8.2 10*3/uL — ABNORMAL HIGH (ref 1.7–7.7)
Neutrophils Relative %: 71 % (ref 43–77)
Platelets: 230 K/uL (ref 150–400)
RBC: 4.76 MIL/uL (ref 4.22–5.81)
RDW: 12.6 % (ref 11.5–15.5)
WBC: 11.6 10*3/uL — ABNORMAL HIGH (ref 4.0–10.5)

## 2011-09-23 LAB — COMPREHENSIVE METABOLIC PANEL WITH GFR
BUN: 54 mg/dL — ABNORMAL HIGH (ref 6–23)
Calcium: 9.1 mg/dL (ref 8.4–10.5)
GFR calc Af Amer: 22 mL/min — ABNORMAL LOW (ref >90)
Glucose, Bld: 140 mg/dL — ABNORMAL HIGH (ref 70–99)
Sodium: 137 meq/L (ref 135–145)
Total Protein: 7.5 g/dL (ref 6.0–8.3)

## 2011-09-23 LAB — COMPREHENSIVE METABOLIC PANEL
ALT: 18 U/L (ref 0–53)
AST: 18 U/L (ref 0–37)
Albumin: 3.7 g/dL (ref 3.5–5.2)
Alkaline Phosphatase: 81 U/L (ref 39–117)
CO2: 23 mEq/L (ref 19–32)
Chloride: 102 mEq/L (ref 96–112)
Creatinine, Ser: 3.3 mg/dL — ABNORMAL HIGH (ref 0.50–1.35)
GFR calc non Af Amer: 19 mL/min — ABNORMAL LOW (ref >90)
Potassium: 4.1 mEq/L (ref 3.5–5.1)
Total Bilirubin: 0.3 mg/dL (ref 0.3–1.2)

## 2011-09-23 LAB — URINALYSIS, ROUTINE W REFLEX MICROSCOPIC
Bilirubin Urine: NEGATIVE
Glucose, UA: NEGATIVE mg/dL
Ketones, ur: NEGATIVE mg/dL
Leukocytes, UA: NEGATIVE
Nitrite: NEGATIVE
Protein, ur: 30 mg/dL — AB
Specific Gravity, Urine: 1.015 (ref 1.005–1.030)
Urobilinogen, UA: 0.2 mg/dL (ref 0.0–1.0)
pH: 5 (ref 5.0–8.0)

## 2011-09-23 LAB — URINE MICROSCOPIC-ADD ON

## 2011-09-23 MED ORDER — INSULIN ASPART 100 UNIT/ML ~~LOC~~ SOLN
0.0000 [IU] | Freq: Three times a day (TID) | SUBCUTANEOUS | Status: DC
  Administered 2011-09-24: 1 [IU] via SUBCUTANEOUS

## 2011-09-23 MED ORDER — INSULIN ASPART 100 UNIT/ML ~~LOC~~ SOLN: 0.0000 [IU] | Freq: Every day | SUBCUTANEOUS | Status: DC

## 2011-09-23 MED ORDER — ONE-DAILY MULTI VITAMINS PO TABS: 1.0000 | ORAL_TABLET | Freq: Every day | ORAL | Status: DC

## 2011-09-23 MED ORDER — IOHEXOL 300 MG/ML  SOLN
20.0000 mL | INTRAMUSCULAR | Status: AC
  Administered 2011-09-23: 20 mL via ORAL

## 2011-09-23 MED ORDER — AMLODIPINE BESYLATE 10 MG PO TABS
10.0000 mg | ORAL_TABLET | Freq: Every day | ORAL | Status: DC
  Administered 2011-09-23 – 2011-09-25 (×3): 10 mg via ORAL
  Filled 2011-09-23 (×3): qty 1

## 2011-09-23 MED ORDER — SODIUM CHLORIDE 0.9 % IV SOLN
INTRAVENOUS | Status: AC
  Administered 2011-09-23: 22:00:00 via INTRAVENOUS

## 2011-09-23 MED ORDER — SODIUM CHLORIDE 0.9 % IV SOLN: 250.0000 mL | INTRAVENOUS | Status: DC | PRN

## 2011-09-23 MED ORDER — ADULT MULTIVITAMIN W/MINERALS CH
1.0000 | ORAL_TABLET | Freq: Every day | ORAL | Status: DC
  Administered 2011-09-23 – 2011-09-25 (×3): 1 via ORAL
  Filled 2011-09-23 (×3): qty 1

## 2011-09-23 NOTE — ED Notes (Signed)
Pt began drinking contrast at 940p. Should finish around 1140p. Eddie Candle, RN informed that pt will finish contrast, go to CT, then be transported to 6N.

## 2011-09-23 NOTE — Consult Note (Signed)
Urology Consult  Referring physician: Dr. Ashley Royalty Reason for referral: Rt. Hydronephrosis  History of Present Illness: Mr. Bethel is a 61 year old male who was seen in hospital consultation today for further evaluation of newly diagnosed right hydronephrosis and an elevated creatinine. He had a creatinine in 12/12 a 1.5. In 7/13 it was 1.4 however in 8/12 it had risen to 2.0 and in 8/20 it was 3.5. He was contacted and told to come to the emergency room because of his elevated creatinine and was noted to be 3.1 today. He has a history of adenocarcinoma of the prostate diagnosed in 11/12 at which time his PSA was found to be 59.66. He underwent TRUS/BX by Dr. Reuel Boom in [place] and was found to have adenocarcinoma of the prostate however I do not know the Gleason score. The patient reports that his bone scan and CT scan revealed no evidence of metastatic disease. It appears he was referred to an oncologist Dr. Renae Fickle who has managed him with Lupron injections. He said his PSA fell to a low 5.1 and when was last checked last month it increased slightly to 7.3.  Last week he was doing heavy lifting and began experiencing some low back pain on the right-hand side and brought it was due to the lifting. He is never had a kidney stone and he has never seen any gross hematuria. He has no significant obstructive voiding symptoms however he does report getting up 3-4 times at night. He's never had a UTI or prostatitis. He denies any bone pain or unexplained weight loss. He has no flank pain currently. Renal ultrasound was performed and revealed right hydronephrosis. The hydronephrosis is of moderate severity with no modifying factors are associated signs and symptoms. The duration of its presence is unknown.  Past Medical History  Diagnosis Date  . Hand numbness   . Carpal tunnel syndrome     right  . Diabetes type 2, controlled     diet controlled  . Mixed hyperlipidemia   . HTN (hypertension)   .  Prostate cancer     Dr. Altamese [place] - advanced prostate cancer, Gleason 5+4, on lupron per Dr. Renae Fickle (onc)   Past Surgical History  Procedure Date  . Tonsillectomy 1960's  . Wisdom tooth extraction 1968  . Carpal tunnel release 10/12/06    Right    Medications: Prior to Admission:  (Not in a hospital admission)  Allergies:  Allergies  Allergen Reactions  . Lisinopril Other (See Comments)    creatinine elevation 1.4->2.0    Family History  Problem Relation Age of Onset  . Coronary artery disease Mother     PTCA  . Healthy Father   . Thyroid disease Brother   . Heart attack Maternal Grandfather   . Heart attack Paternal Grandfather   . Heart attack Paternal Grandmother   . Heart attack Paternal Aunt   . Diabetes      Both sides  . Prostate cancer Neg Hx   . Colon cancer Neg Hx   . Breast cancer Neg Hx     Social History:  reports that he has been smoking Cigarettes.  He has a 10 pack-year smoking history. He has never used smokeless tobacco. He reports that he does not drink alcohol or use illicit drugs.  Review of Systems: Pertinent items are noted in HPI. A comprehensive review of systems was negative except for:as noted above  Physical Exam:  Vital signs in last 24 hours: Temp:  [98.2 F (36.8 C)-98.5  F (36.9 C)] 98.5 F (36.9 C) (08/22 1832) Pulse Rate:  [69-93] 69  (08/22 1832) Resp:  [16-20] 20  (08/22 1832) BP: (160-161)/(70-84) 161/70 mmHg (08/22 1832) SpO2:  [96 %-98 %] 98 % (08/22 1832) General appearance: alert and appears stated age Head: Normocephalic, without obvious abnormality, atraumatic Eyes: conjunctivae/corneas clear. EOM's intact.  Oropharynx: moist mucous membranes Neck: supple, symmetrical, trachea midline Resp: normal respiratory effort Cardio: regular rate and rhythm Back: symmetric, no curvature. ROM normal. No CVA tenderness. GI: soft, non-tender; bowel sounds normal; no masses,  no organomegaly Male genitalia: penis: normal  male phallus with no lesions or discharge.he is a Foley catheter indwelling and it is draining clear urine.Testes: bilaterally descended with no masses or tenderness. no hernias Rectal reveals normal sphincter tone. His prostate is enlarged, hard, fixed and quite nodular. Extremities: extremities normal, atraumatic, no cyanosis or edema Skin: Skin color normal. No visible rashes or lesions Neurologic: Grossly normal  Laboratory Data:   Basename 09/23/11 1119  WBC 11.6*  HGB 14.1  HCT 40.1   BMET  Basename 09/23/11 1119 09/21/11 0811  NA 137 136  K 4.1 4.8  CL 102 104  CO2 23 23  GLUCOSE 140* 169*  BUN 54* 53*  CREATININE 3.30* 3.5*  CALCIUM 9.1 9.0   No results found for this basename: LABPT:3,INR:3 in the last 72 hours No results found for this basename: LABURIN:1 in the last 72 hours No results found for this or any previous visit. Creatinine:  Basename 09/23/11 1119 09/21/11 0811  CREATININE 3.30* 3.5*    Imaging: US Renal  09/23/2011  *RADIOLOGY REPORT*  Clinical Data: Elevated creatinine.  Prostate cancer and hypertension  RENAL/URINARY TRACT ULTRASOUND COMPLETE  Comparison:  None.  Findings:  Right Kidney:  13.6 cm.  Moderate right-sided hydronephrosis.  2.5 cm cyst mid kidney.  Otherwise normal cortex.  Left Kidney:  12.1 cm in length.  Negative for hydronephrosis. Renal cortex is normal.  2 cm cyst lower pole.  Bladder:  Empty urinary bladder with a Foley catheter.  IMPRESSION: Moderate right hydronephrosis.  No obstruction of the left kidney.   Original Report Authenticated By: Camelia Phenes, M.D.     Impression/Assessment:  1. Right hydronephrosis. The etiology of his hydronephrosis is currently unknown although he has no history of kidney stones. He has not had any hematuria and unfortunately the most likely cause is obstruction either from locally advanced prostate cancer or from pelvic adenopathy although further imaging will need to be performed in order to  determine the exact etiology. I have discussed proceeding with a CT scan of the abdomen and pelvis without contrast with the patient. We then discussed the possible options for treatment which would include the placement of a double-J stent cystoscopically versus a percutaneous nephrostomy tube initially with consideration of internalization with a double-J stent.  2. Locally advanced adenocarcinoma of the prostate. It sounds as if he had locally advanced disease with a markedly elevated PSA of 55.66 in 11/12. He's been on Lupron but his PSA does not appear to have fallen to as low as one would hope and in addition it has begun to show a recent rise. His exam is consistent with locally advanced disease. I am going to obtain a PSA today.  3. Acute on chronic renal insufficiency. It appears his creatinine runs at about 1.4-1.5 which is the upper limit of normal. A creatinine of 3.3 seems high for the degree of hydronephrosis seen and the fact that he has  a completely unobstructed contralateral kidney. I suspect there is some chronic renal disease present and at least currently he has no significant electrolyte abnormalities and specifically no elevation of his potassium that would necessitate emergent decompression of his kidney.  Plan:  1. Obtain CT scan of the abdomen and pelvis without contrast. 2. I will check a PSA. 3. He will likely need some form of procedure to unobstruct his right kidney.  Burnie Therien C 09/23/2011, 8:25 PM

## 2011-09-23 NOTE — H&P (Signed)
Hospital Admission Note Date: 09/23/2011  Patient name: Francisco Dunn Medical record number: [id number] Date of birth: Sep 23, 1950 Age: 62 y.o. Gender: male PCP: Eustaquio Boyden, MD  Attending physician: Gavin Pound. Oletta Lamas, MD  Chief Complaint:Abnormal labs.  History of Present Illness:Pt seen by PMD a few days ago and had labs drawn which showed progressive worsening of renal function. He was asked to come to the ED for further evaluation. Historically the patient has been on Lisinopril which was discontinued 8 days ago, but has still had a continued rise in creatinine.   Pt states that he has been feeling well and has had no complaints. He denies any CP, dizziness, SOB, difficulty breathing. The patient is on active treatment for prostate cancer and states that since treatment started, he has been having frequency and urgency.   Patient wants to go home. I have explained that we need to ensure that he does not have a hydronephrosis before discharging him.The patient is willing to wait to have a renal ultrasound.    Scheduled Meds:   Continuous Infusions:   PRN Meds:.   Allergies: Lisinopril Past Medical History  Diagnosis Date  . Hand numbness   . Carpal tunnel syndrome     right  . Diabetes type 2, controlled     diet controlled  . Mixed hyperlipidemia   . HTN (hypertension)   . Prostate cancer     Dr. Altamese [place] - advanced prostate cancer, Gleason 5+4, on lupron per Dr. Renae Fickle (onc)   Past Surgical History  Procedure Date  . Tonsillectomy 1960's  . Wisdom tooth extraction 1968  . Carpal tunnel release 10/12/06    Right   Family History  Problem Relation Age of Onset  . Coronary artery disease Mother     PTCA  . Healthy Father   . Thyroid disease Brother   . Heart attack Maternal Grandfather   . Heart attack Paternal Grandfather   . Heart attack Paternal Grandmother   . Heart attack Paternal Aunt   . Diabetes      Both sides  . Prostate cancer Neg Hx   . Colon  cancer Neg Hx   . Breast cancer Neg Hx    History   Social History  . Marital Status: Married    Spouse Name: N/A    Number of Children: 5  . Years of Education: N/A   Occupational History  . Not on file.   Social History Main Topics  . Smoking status: Current Everyday Smoker -- 0.5 packs/day for 20 years    Types: Cigarettes  . Smokeless tobacco: Never Used  . Alcohol Use: No  . Drug Use: No  . Sexually Active: Not on file   Other Topics Concern  . 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 Physical Exam: No intake or output data in the 24 hours ending 09/23/11 1917 General: Alert, awake, oriented x3, in no acute distress.  HEENT: [place]/AT PEERL, EOMI OROPHARYNX:  Moist, No exudate/ erythema/lesions.  Heart: Regular rate and rhythm, without murmurs, rubs, gallops.  Lungs: Clear to auscultation, no wheezing or rhonchi noted.   Abdomen: Soft, nontender, nondistended, positive bowel sounds, no masses no hepatosplenomegaly noted.  Neuro: No focal neurological deficits noted cranial nerves II through XII grossly intact. Musculoskeletal: No warm swelling or erythema around joints, no spinal  tenderness noted. Psychiatric: Patient alert and oriented x3, good insight and cognition, good recent to remote recall.  Lab results:  Basename 09/23/11 1119 09/21/11 0811  NA 137 136  K 4.1 4.8  CL 102 104  CO2 23 23  GLUCOSE 140* 169*  BUN 54* 53*  CREATININE 3.30* 3.5*  CALCIUM 9.1 9.0  MG -- --  PHOS -- --    Basename 09/23/11 1119  AST 18  ALT 18  ALKPHOS 81  BILITOT 0.3  PROT 7.5  ALBUMIN 3.7   No results found for this basename: LIPASE:2,AMYLASE:2 in the last 72 hours  Basename 09/23/11 1119  WBC 11.6*  NEUTROABS 8.2*  HGB 14.1  HCT 40.1  MCV 84.2  PLT 230   No results found for  this basename: CKTOTAL:3,CKMB:3,CKMBINDEX:3,TROPONINI:3 in the last 72 hours No components found with this basename: POCBNP:3 No results found for this basename: DDIMER:2 in the last 72 hours No results found for this basename: HGBA1C:2 in the last 72 hours No results found for this basename: CHOL:2,HDL:2,LDLCALC:2,TRIG:2,CHOLHDL:2,LDLDIRECT:2 in the last 72 hours No results found for this basename: TSH,T4TOTAL,FREET3,T3FREE,THYROIDAB in the last 72 hours No results found for this basename: VITAMINB12:2,FOLATE:2,FERRITIN:2,TIBC:2,IRON:2,RETICCTPCT:2 in the last 72 hours Imaging results:  US Renal  09/23/2011  *RADIOLOGY REPORT*  Clinical Data: Elevated creatinine.  Prostate cancer and hypertension  RENAL/URINARY TRACT ULTRASOUND COMPLETE  Comparison:  None.  Findings:  Right Kidney:  13.6 cm.  Moderate right-sided hydronephrosis.  2.5 cm cyst mid kidney.  Otherwise normal cortex.  Left Kidney:  12.1 cm in length.  Negative for hydronephrosis. Renal cortex is normal.  2 cm cyst lower pole.  Bladder:  Empty urinary bladder with a Foley catheter.  IMPRESSION: Moderate right hydronephrosis.  No obstruction of the left kidney.   Original Report Authenticated By: Camelia Phenes, M.D.    Other results:  Patient Active Hospital Problem List: Acute renal failure (09/23/2011)   Assessment: This is likely secondary to the hydronephrosis seen on renal ultrasound.    Plan: Consult Urology  Diet-controlled type 2 diabetes mellitus (12/15/2007)   Assessment: Check BS Ac and HS and cover with SSI    HYPERTENSION (12/12/2007)   Assessment: Continue Norvasc   Hydronephrosis (09/23/2011)   Assessment: Will consult  Urology    Total time spent in initial evaluation 40 minutes.   MATTHEWS,MICHELLE A. 09/23/2011, 7:17 PM

## 2011-09-23 NOTE — Telephone Encounter (Signed)
Patient notified as instructed by telephone to go to [facility] ER now for eval of acute renal failure; kidney function has deteriorated per Selena Batten. Pt understood and will go to [facility] ER now.

## 2011-09-23 NOTE — ED Notes (Signed)
Admitting MD Ashley Royalty) at bedside

## 2011-09-23 NOTE — ED Provider Notes (Signed)
History     CSN: [id number]  Arrival date & time 09/23/11  1057   First MD Initiated Contact with Patient 09/23/11 1423      Chief Complaint  Patient presents with  . Abnormal Lab    (Consider location/radiation/quality/duration/timing/severity/associated sxs/prior treatment) Patient is a 61 y.o. male presenting with general illness. The history is provided by the patient.  Illness  The current episode started today. The onset was sudden. The problem occurs continuously. The problem has been unchanged. The problem is moderate. Nothing relieves the symptoms. Nothing aggravates the symptoms. Pertinent negatives include no fever, no abdominal pain, no constipation, no diarrhea, no nausea, no vomiting, no headaches, no muscle aches and no cough.    Past Medical History  Diagnosis Date  . Hand numbness   . Carpal tunnel syndrome     right  . Diabetes type 2, controlled     diet controlled  . Mixed hyperlipidemia   . HTN (hypertension)   . Prostate cancer     Dr. Altamese [place] - advanced prostate cancer, Gleason 5+4, on lupron per Dr. Renae Fickle (onc)    Past Surgical History  Procedure Date  . Tonsillectomy 1960's  . Wisdom tooth extraction 1968  . Carpal tunnel release 10/12/06    Right    Family History  Problem Relation Age of Onset  . Coronary artery disease Mother     PTCA  . Healthy Father   . Thyroid disease Brother   . Heart attack Maternal Grandfather   . Heart attack Paternal Grandfather   . Heart attack Paternal Grandmother   . Heart attack Paternal Aunt   . Diabetes      Both sides  . Prostate cancer Neg Hx   . Colon cancer Neg Hx   . Breast cancer Neg Hx     History  Substance Use Topics  . Smoking status: Current Everyday Smoker -- 0.5 packs/day for 20 years    Types: Cigarettes  . Smokeless tobacco: Never Used  . Alcohol Use: No      Review of Systems  Constitutional: Negative for fever and chills.  Respiratory: Negative for cough and  shortness of breath.   Cardiovascular: Negative for chest pain and leg swelling.  Gastrointestinal: Negative for nausea, vomiting, abdominal pain, diarrhea and constipation.  Genitourinary: Positive for frequency. Negative for dysuria and decreased urine volume.  Musculoskeletal: Negative for back pain.  Neurological: Negative for weakness, light-headedness and headaches.  All other systems reviewed and are negative.    Allergies  Lisinopril  Home Medications   Current Outpatient Rx  Name Route Sig Dispense Refill  . AMLODIPINE BESYLATE 10 MG PO TABS Oral Take 1 tablet (10 mg total) by mouth daily. 90 tablet 3  . GARLIC PO Oral Take 1 capsule by mouth daily.     Marland Kitchen LEUPROLIDE ACETATE (3 MONTH) 22.5 MG IM KIT Intramuscular Inject 22.5 mg into the muscle every 3 (three) months.    . ONE-DAILY MULTI VITAMINS PO TABS Oral Take 1 tablet by mouth daily.      Marland Kitchen VITAMIN B-6 PO Oral Take 1 tablet by mouth daily.      Marland Kitchen VITAMIN C 500 MG PO TABS Oral Take 500 mg by mouth daily.      BP 160/84  Pulse 93  Temp 98.2 F (36.8 C) (Oral)  Resp 16  SpO2 96%  Physical Exam  Nursing note and vitals reviewed. Constitutional: He is oriented to person, place, and time. He appears well-developed and  well-nourished.  HENT:  Head: Normocephalic and atraumatic.  Right Ear: External ear normal.  Left Ear: External ear normal.  Nose: Nose normal.  Mouth/Throat: Oropharynx is clear and moist.  Eyes: No scleral icterus.  Neck: Neck supple.  Cardiovascular: Normal rate, regular rhythm, normal heart sounds and intact distal pulses.   Pulmonary/Chest: Effort normal and breath sounds normal.  Abdominal: Soft. He exhibits no distension. There is no tenderness. There is no CVA tenderness.  Musculoskeletal: He exhibits no edema.  Neurological: He is alert and oriented to person, place, and time.  Skin: Skin is warm and dry.    ED Course  Procedures (including critical care time)  Labs Reviewed  CBC  WITH DIFFERENTIAL - Abnormal; Notable for the following:    WBC 11.6 (*)     Neutro Abs 8.2 (*)     Monocytes Absolute 1.3 (*)     All other components within normal limits  COMPREHENSIVE METABOLIC PANEL - Abnormal; Notable for the following:    Glucose, Bld 140 (*)     BUN 54 (*)     Creatinine, Ser 3.30 (*)     GFR calc non Af Amer 19 (*)     GFR calc Af Amer 22 (*)     All other components within normal limits  URINALYSIS, ROUTINE W REFLEX MICROSCOPIC   No results found.   1. Acute kidney injury       MDM  Increasing creatinine from 1.4 to 3.3 over less than one month, increasing especially over last week. No signs of hypovolemia or prerenal cause. Otherwise asymptomatic. Hx of prostate CA, but no worsening symptoms. Foley approx 30 min after urinating shows nearly empty bladder (~35cc). Likely intrinsic renal problem such as medicine side effects. Due to rapid increase will admit to hospitalist for further workup.        Pricilla Loveless, MD 09/23/11 1537

## 2011-09-23 NOTE — Telephone Encounter (Signed)
Noted. Thanks.  Agree with ER evaluation given ARF.

## 2011-09-23 NOTE — Telephone Encounter (Signed)
Can we continue trying to contact patient, also call [facility] to see if admitted there, and send certified letter today regarding kidney failure.  Letter in chart.

## 2011-09-23 NOTE — ED Notes (Signed)
Pt sent here from his pcp; Dr. Sharen Hones d/t abnormal BUN and Creatinine, r/o renal failure. Pt reports hx of prostate cancer

## 2011-09-23 NOTE — ED Provider Notes (Signed)
I saw and evaluated the patient, reviewed the resident's note and I agree with the findings and plan.  Pt with worsening renal insuff, seen by PCP and urged to come to the ED for admission.  K+ is ok, no respiratory symptoms.  Vitals otherwise ok.  Pt is on ACE and has h/o HTN.  No dysuria.  Has some prostatic hypertrophy.    Gavin Pound. Oletta Lamas, MD 09/23/11 4098

## 2011-09-23 NOTE — Progress Notes (Signed)
Disposition Note  Francisco Dunn, is a 61 y.o. male,   MRN: [id number]  -  DOB - 04/01/1950  Outpatient Primary MD for the patient is Eustaquio Boyden, MD   Blood pressure 160/84, pulse 93, temperature 98.2 F (36.8 C), temperature source Oral, resp. rate 16, SpO2 96.00%.  Principal Problem:  *Acute renal failure Active Problems:  Leukocytosis  Diet-controlled type 2 diabetes mellitus  HYPERTENSION  Elderly gentleman who presented to the emergency department after he was notified by his primary care provider that he had abnormal elevation in serum creatinine. In July 2013 patient had a BUN of 19 and a creatinine of 1.4. At presentation today the patient's creatinine was up to 3.30 with a BUN of 54. It is noted that the patient is not on any offending medications such as ACE inhibitors or diuretics. He has not had any symptoms such as nausea vomiting or diarrhea that may have caused him to have prerenal azotemia. The emergency room physician is suspicious of possible urinary retention given the patient's history of prostate cancer so a Foley catheter has been ordered but has not yet been placed. I discussed with the emergency room physician Dr. Criss Alvine and plans are to admit this patient to 6700 on telemetry. I have notified of a manager does better quit. Have not interviewed or examined this patient.  Junious Silk, ANP

## 2011-09-23 NOTE — Telephone Encounter (Signed)
Message left for patient advising to contact office ASAP. [facility] and patient is not there. I did look up patient's wife in the system and found numbers for her. I was able to reach her and advise her to have patient go to the ER for ARF. She asked if that was why his back was hurting so bad. I advised it could possibly have something to do with it and he needed to go to ER now to avoid further kidney damage. She said she would contact him at work and have him go to [facility] ER now.

## 2011-09-24 ENCOUNTER — Encounter (HOSPITAL_COMMUNITY): Payer: Self-pay | Admitting: Radiology

## 2011-09-24 ENCOUNTER — Inpatient Hospital Stay (HOSPITAL_COMMUNITY): Payer: Managed Care, Other (non HMO)

## 2011-09-24 LAB — PROTIME-INR: Prothrombin Time: 14.7 seconds (ref 11.6–15.2)

## 2011-09-24 LAB — GLUCOSE, CAPILLARY
Glucose-Capillary: 120 mg/dL — ABNORMAL HIGH (ref 70–99)
Glucose-Capillary: 123 mg/dL — ABNORMAL HIGH (ref 70–99)
Glucose-Capillary: 126 mg/dL — ABNORMAL HIGH (ref 70–99)
Glucose-Capillary: 151 mg/dL — ABNORMAL HIGH (ref 70–99)

## 2011-09-24 LAB — BASIC METABOLIC PANEL
CO2: 25 mEq/L (ref 19–32)
Calcium: 8.7 mg/dL (ref 8.4–10.5)
GFR calc non Af Amer: 18 mL/min — ABNORMAL LOW (ref >90)
Potassium: 4.7 mEq/L (ref 3.5–5.1)
Sodium: 137 mEq/L (ref 135–145)

## 2011-09-24 LAB — HEMOGLOBIN A1C: Mean Plasma Glucose: 137 mg/dL — ABNORMAL HIGH (ref <117)

## 2011-09-24 LAB — APTT: aPTT: 31 seconds (ref 24–37)

## 2011-09-24 LAB — PSA: PSA: 21.13 ng/mL — ABNORMAL HIGH (ref <=4.00)

## 2011-09-24 MED ORDER — HYDROCODONE-ACETAMINOPHEN 5-325 MG PO TABS: 1.0000 | ORAL_TABLET | Freq: Four times a day (QID) | ORAL | Status: DC | PRN

## 2011-09-24 MED ORDER — DIPHENHYDRAMINE HCL 50 MG/ML IJ SOLN
25.0000 mg | Freq: Once | INTRAMUSCULAR | Status: AC
  Administered 2011-09-24: 25 mg via INTRAVENOUS

## 2011-09-24 MED ORDER — SODIUM CHLORIDE 0.9 % IJ SOLN: 3.0000 mL | INTRAMUSCULAR | Status: DC | PRN

## 2011-09-24 MED ORDER — SODIUM CHLORIDE 0.9 % IV SOLN
INTRAVENOUS | Status: AC | PRN
  Administered 2011-09-24: 20 mL/h via INTRAVENOUS

## 2011-09-24 MED ORDER — FENTANYL CITRATE 0.05 MG/ML IJ SOLN
INTRAMUSCULAR | Status: AC | PRN
  Administered 2011-09-24: 50 ug via INTRAVENOUS
  Administered 2011-09-24: 25 ug via INTRAVENOUS

## 2011-09-24 MED ORDER — MIDAZOLAM HCL 5 MG/5ML IJ SOLN
INTRAMUSCULAR | Status: AC | PRN
  Administered 2011-09-24: 1 mg via INTRAVENOUS
  Administered 2011-09-24: 0.5 mg via INTRAVENOUS

## 2011-09-24 MED ORDER — CIPROFLOXACIN IN D5W 400 MG/200ML IV SOLN
400.0000 mg | INTRAVENOUS | Status: AC
  Administered 2011-09-24: 400 mg via INTRAVENOUS
  Filled 2011-09-24: qty 200

## 2011-09-24 MED ORDER — IOHEXOL 300 MG/ML  SOLN
50.0000 mL | Freq: Once | INTRAMUSCULAR | Status: AC | PRN
  Administered 2011-09-24: 10 mL via ORAL

## 2011-09-24 MED ORDER — MIDAZOLAM HCL 2 MG/2ML IJ SOLN
INTRAMUSCULAR | Status: AC
  Filled 2011-09-24: qty 4

## 2011-09-24 MED ORDER — BICALUTAMIDE 50 MG PO TABS
50.0000 mg | ORAL_TABLET | Freq: Every day | ORAL | Status: DC
  Administered 2011-09-24 – 2011-09-25 (×2): 50 mg via ORAL
  Filled 2011-09-24 (×3): qty 1

## 2011-09-24 MED ORDER — SODIUM CHLORIDE 0.9 % IJ SOLN
3.0000 mL | Freq: Two times a day (BID) | INTRAMUSCULAR | Status: DC
  Administered 2011-09-24 – 2011-09-25 (×3): 3 mL via INTRAVENOUS

## 2011-09-24 MED ORDER — FENTANYL CITRATE 0.05 MG/ML IJ SOLN
INTRAMUSCULAR | Status: AC
  Filled 2011-09-24: qty 4

## 2011-09-24 NOTE — Progress Notes (Signed)
UR complete 

## 2011-09-24 NOTE — Progress Notes (Signed)
Patient ID: Francisco Dunn, male   DOB: [birth year], 61 y.o.   MRN: [id number]  Subjective: Patient reports no new complaints this morning.  Objective: Vital signs in last 24 hours: Temp:  [97.9 F (36.6 C)-98.5 F (36.9 C)] 98.3 F (36.8 C) (08/23 0525) Pulse Rate:  [65-93] 66  (08/23 0525) Resp:  [16-20] 18  (08/23 0525) BP: (122-161)/(70-84) 122/72 mmHg (08/23 0525) SpO2:  [96 %-100 %] 98 % (08/23 0525) Weight:  [102.4 kg (225 lb 12 oz)] 102.4 kg (225 lb 12 oz) (08/23 0035)A  Intake/Output from previous day: 08/22 0701 - 08/23 0700 In: -  Out: 1400 [Urine:1400] Intake/Output this shift: Total I/O In: -  Out: 1400 [Urine:1400]  Past Medical History  Diagnosis Date  . Hand numbness   . Carpal tunnel syndrome     right  . Diabetes type 2, controlled     diet controlled  . Mixed hyperlipidemia   . HTN (hypertension)   . Prostate cancer     Dr. Altamese [place] - advanced prostate cancer, Gleason 5+4, on lupron per Dr. Renae Fickle (onc)    Physical Exam:  No new PE findings noted today.  Lab Results:  Basename 09/23/11 1119  WBC 11.6*  HGB 14.1  HCT 40.1   BMET  Basename 09/23/11 1119 09/21/11 0811  NA 137 136  K 4.1 4.8  CL 102 104  CO2 23 23  GLUCOSE 140* 169*  BUN 54* 53*  CREATININE 3.30* 3.5*  CALCIUM 9.1 9.0   No results found for this basename: LABURIN:1 in the last 72 hours No results found for this or any previous visit.  Studies/Results: @RISRSLT24 @  Assessment/Plan: Prostate cancer: I note he has high-grade (Gleason 5+4 = 9) adenocarcinoma of the prostate per his oncologist. His PSA is increasing rapidly and was found to be 21.13 yesterday. This indicates likely hormone resistant disease. I have discussed beginning Casodex however if his PSA does not respond he will likely need a second line agent such as Zytiga. 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 I have discussed this with the patient today.  Right hydronephrosis: His CT scan reveals no stone within the ureter although there was a calcification in the bladder on the floor but could potentially have been the ureteral stone causing his obstruction. I do not see a point of obstruction although the proximal ureter does appear dilated. In addition I note a degree of atrophy of his left kidney which likely accounts for the abnormally elevated creatinine with a single obstructed renal unit.  1. Begin Casodex 50 mg daily. 2. He will have a Rt. perc. nephrostomy tube placed by IR today. 3. Serial Cr.     Garnett Farm 09/24/2011, 6:58 AM

## 2011-09-24 NOTE — Progress Notes (Signed)
[facility] PROGRESS NOTE  Kacee Koren ZOX:[id number] DOB: 04-14-1950 DOA: 09/23/2011 PCP: Eustaquio Boyden, MD  Assessment/Plan: Principal Problem:  *Acute renal failure Active Problems:  Diet-controlled type 2 diabetes mellitus  HYPERTENSION  Hydronephrosis  1. Acute renal failure/hydronephrosis: Renal ultrasound revealed acute unilateral hydronephrosis likely secondary to local disease at the base of the bladder versus a urethral stone causing an obstruction. The patient is status post nephrostomy tube placement by interventional radiology today. We'll reevaluate his renal function in the morning. His lites the patient will be discharged with nephrostomy tube and be considered for stenting at a later date. 2. Diet controlled diabetes type 2: Blood sugars are well-controlled and his hospitalization and hemoglobin A1c shows relatively good control. 3. Prostate cancer: The patient is actively being treated for prostate cancer his PSA then elevated 21.13 and CT scan shows likely metastatic disease. He has been changed over to AcipHex from Lupron by Dr. Veverly Fells and they will continue to manage his prostate cancer based on his clinical course.  Code Status: Full code Family Communication: Wife at bedside and updated Disposition Plan: Plan is to discharge home in medically stable  MATTHEWS,MICHELLE A.  [facility] Pager [phone]. If 8PM-8AM, please contact night-coverage at [email or web address], password [facility] 09/24/2011, 7:40 PM  LOS: 1 day   Brief narrative: Pt seen by PMD a few days ago and had labs drawn which showed progressive worsening of renal function. He was asked to come to the ED for further evaluation. Historically the patient has been on Lisinopril which was discontinued 8 days ago, but has still had a continued rise in creatinine.  Pt states that he has been feeling well and has had no complaints. He denies any CP, dizziness, SOB, difficulty breathing. The patient is on  active treatment for prostate cancer and states that since treatment started, he has been having frequency and urgency.     Consultants:  Urology-Dr. Vernie Ammons  Interventional radiology-Dr. Lowella Dandy  Procedures:  S/P percutaneous nephrostomy tube placement 09/24/2011  Antibiotics:  None  HPI/Subjective: Patient without complaints. I think the patient is concerned about his condition but trying to be strong for his wife.  Objective: Filed Vitals:   09/24/11 1450 09/24/11 1456 09/24/11 1502 09/24/11 1731  BP: 135/88 151/87 153/86 138/72  Pulse: 74 74 85 74  Temp:    97.7 F (36.5 C)  TempSrc:    Oral  Resp: 13 13 15 14   Height:      Weight:      SpO2: 100% 99% 96% 95%   Weight change:   Intake/Output Summary (Last 24 hours) at 09/24/11 1940 Last data filed at 09/24/11 1800  Gross per 24 hour  Intake    623 ml  Output   3000 ml  Net  -2377 ml   General: Alert, awake, oriented x3, in no acute distress.  HEENT: [place]/AT PEERL, EOMI  OROPHARYNX: Moist, No exudate/ erythema/lesions.  Heart: Regular rate and rhythm, without murmurs, rubs, gallops.  Lungs: Clear to auscultation, no wheezing or rhonchi noted.  Abdomen: Soft, nontender, nondistended, positive bowel sounds, no masses no hepatosplenomegaly noted. Right-sided nephrostomy tube in place and draining well   Data Reviewed: Basic Metabolic Panel:  Lab 09/24/11 8295 09/23/11 1119 09/21/11 0811  NA 137 137 136  K 4.7 4.1 4.8  CL 101 102 104  CO2 25 23 23   GLUCOSE 117* 140* 169*  BUN 49* 54* 53*  CREATININE 3.46* 3.30* 3.5*  CALCIUM 8.7 9.1 9.0  MG -- -- --  PHOS -- -- --   Liver Function Tests:  Lab 09/23/11 1119  AST 18  ALT 18  ALKPHOS 81  BILITOT 0.3  PROT 7.5  ALBUMIN 3.7   No results found for this basename: LIPASE:5,AMYLASE:5 in the last 168 hours No results found for this basename: AMMONIA:5 in the last 168 hours CBC:  Lab 09/23/11 1119  WBC 11.6*  NEUTROABS 8.2*  HGB 14.1  HCT 40.1  MCV  84.2  PLT 230   Cardiac Enzymes: No results found for this basename: CKTOTAL:5,CKMB:5,CKMBINDEX:5,TROPONINI:5 in the last 168 hours BNP (last 3 results) No results found for this basename: PROBNP:3 in the last 8760 hours CBG:  Lab 09/24/11 1735 09/24/11 1656 09/24/11 1152 09/24/11 0811 09/24/11 0018  GLUCAP 151* 97 99 123* 126*    No results found for this or any previous visit (from the past 240 hour(s)).   Studies: Ct Abdomen Pelvis Wo Contrast  09/24/2011  *RADIOLOGY REPORT*  Clinical Data: New right hydronephrosis and abnormal BUN and creatinine in a patient with history of prostate cancer.  CT ABDOMEN AND PELVIS WITHOUT CONTRAST  Technique:  Multidetector CT imaging of the abdomen and pelvis was performed following the standard protocol without intravenous contrast.  Comparison: Ultrasound renal 09/23/2011.  Findings: Linear atelectasis in the lung bases.  There is a right renal pyelocaliectasis and ureterectasis with pararenal and periureteral stranding.  Changes are consistent with obstruction or infection.  No discrete ureteral stone is demonstrated.  There is a small fleck of calcification in the decompressed bladder which could represent a bladder stone although this is located more towards the left side.  Extrinsic compression and / or infection may be present.  There is evidence of a cyst in the right kidney.  The left renal collecting system and ureter are decompressed.  The bladder is decompressed with Foley catheter. There is suggestion of diffuse bladder wall thickening and stranding in the fat around the bladder.  There is a lobular mass between the rectum and bladder measuring 5.2 x 3.8 cm with multiple nodular changes throughout the deep pelvic fat along the iliac chains bilaterally.  This is likely related to the history of prostate cancer with lymph node metastasis.  There is a focal circumscribed low attenuation lesion in the posterior aspect the right lobe of the liver  measuring 15 mm diameter.  This is indeterminate.  There is somewhat geographic low attenuation throughout the liver consistent with focal fatty infiltration.  The unenhanced gallbladder, spleen, pancreas, right adrenal gland, and inferior vena cava are unremarkable.  There is enlargement of the left adrenal gland measuring 60 mm which could represent hyperplasia or an adenoma.  Calcification of the abdominal aorta without aneurysm.  Diffuse retroperitoneal lymphadenopathy involving pericaval and periaortic nodes consistent with metastases.  The stomach and small bowel are decompressed. Stool filled colon without distension or wall thickening.  No free air or free fluid in the abdomen.  Pelvis:  In addition present scattered findings, the appendix is normal and there is no evidence of diverticulitis.  Degenerative changes in the lumbar spine.  Focal sclerosis and mild anterior compression in the anterior body of T10 is likely metastatic. Focal sclerosis in the anterior inferior body of the L4 is also likely metastatic.  IMPRESSION: Lobulated mass in the pelvis and rectum and bladder suggesting prostate cancer with multiple lymph node metastases throughout the pelvis and retroperitoneum.  Sclerotic bone metastases.  The right renal hydronephrosis and ureterectasis with pararenal and periureteral stranding suggesting obstruction and /  or infection. No obstructing stone is demonstrated.  Changes may represent extrinsic compression.   Original Report Authenticated By: Marlon Pel, M.D.    US Renal  09/23/2011  *RADIOLOGY REPORT*  Clinical Data: Elevated creatinine.  Prostate cancer and hypertension  RENAL/URINARY TRACT ULTRASOUND COMPLETE  Comparison:  None.  Findings:  Right Kidney:  13.6 cm.  Moderate right-sided hydronephrosis.  2.5 cm cyst mid kidney.  Otherwise normal cortex.  Left Kidney:  12.1 cm in length.  Negative for hydronephrosis. Renal cortex is normal.  2 cm cyst lower pole.  Bladder:  Empty  urinary bladder with a Foley catheter.  IMPRESSION: Moderate right hydronephrosis.  No obstruction of the left kidney.   Original Report Authenticated By: Camelia Phenes, M.D.    Ir Perc Nephrostomy Right  09/24/2011  *RADIOLOGY REPORT*  Clinical history:61 year old with prostate cancer and right hydronephrosis.  The patient has acute renal failure.  PROCEDURE(S): ULTRASOUND AND FLUOROSCOPIC GUIDED RIGHT PERCUTANEOUS NEPHROSTOMY TUBE PLACEMENT  Physician: Rachelle Hora. Lowella Dandy, MD  Medications:Ciprofloxacin 400 mg.  Versed 1.5 mg, Fentanyl 75 mcg. A radiology nurse monitored the patient for moderate sedation.  Ciprofloxacin was given within two hours of incision.  Moderate sedation time:20 minutes  Fluoroscopy time: 0.7  Procedure:The procedure was explained to the patient.  The risks and benefits of the procedure were discussed and the patient's questions were addressed.  Informed consent was obtained from the patient.  The patient was placed prone on the interventional table. Ultrasound demonstrated moderate right hydronephrosis.  The right flank was prepped and draped in a sterile fashion.  Maximal barrier sterile technique was utilized including caps, mask, sterile gowns, sterile gloves, sterile drape, hand hygiene and skin antiseptic. Skin was anesthetized with lidocaine.  22 gauge Chiba needle was directed into a lower pole calix with ultrasound guidance.  Urine was draining from the needle.  A 0.018 wire was advanced into the renal pelvis.  An Accustick dilator set was placed and contrast was injected to confirm placement in the renal collecting system.  A J wire was advanced into the proximal ureter and the tract was dilated to 10-French.  10.2 [place] multipurpose drain was reconstituted in the renal pelvis.  Catheter was sutured to the skin.  Small amount of contrast was injected to confirm placement in the renal pelvis.  Contrast was aspirated.  Drain was attached to a gravity bag.Fluoroscopic and ultrasound  images were taken and saved for documentation.  Findings:Moderate right hydronephrosis.  The catheter was placed through a lower pole calix.  Catheter was reconstituted in the renal pelvis.  Complications: None  Impression:Successful placement of a right percutaneous nephrostomy tube with ultrasound and fluoroscopic guidance.   Original Report Authenticated By: Richarda Overlie, M.D.    Ir US Guide Bx Asp/drain  09/24/2011  *RADIOLOGY REPORT*  Clinical history:61 year old with prostate cancer and right hydronephrosis.  The patient has acute renal failure.  PROCEDURE(S): ULTRASOUND AND FLUOROSCOPIC GUIDED RIGHT PERCUTANEOUS NEPHROSTOMY TUBE PLACEMENT  Physician: Rachelle Hora. Lowella Dandy, MD  Medications:Ciprofloxacin 400 mg.  Versed 1.5 mg, Fentanyl 75 mcg. A radiology nurse monitored the patient for moderate sedation.  Ciprofloxacin was given within two hours of incision.  Moderate sedation time:20 minutes  Fluoroscopy time: 0.7  Procedure:The procedure was explained to the patient.  The risks and benefits of the procedure were discussed and the patient's questions were addressed.  Informed consent was obtained from the patient.  The patient was placed prone on the interventional table. Ultrasound demonstrated moderate right hydronephrosis.  The  right flank was prepped and draped in a sterile fashion.  Maximal barrier sterile technique was utilized including caps, mask, sterile gowns, sterile gloves, sterile drape, hand hygiene and skin antiseptic. Skin was anesthetized with lidocaine.  22 gauge Chiba needle was directed into a lower pole calix with ultrasound guidance.  Urine was draining from the needle.  A 0.018 wire was advanced into the renal pelvis.  An Accustick dilator set was placed and contrast was injected to confirm placement in the renal collecting system.  A J wire was advanced into the proximal ureter and the tract was dilated to 10-French.  10.2 [place] multipurpose drain was reconstituted in the renal pelvis.   Catheter was sutured to the skin.  Small amount of contrast was injected to confirm placement in the renal pelvis.  Contrast was aspirated.  Drain was attached to a gravity bag.Fluoroscopic and ultrasound images were taken and saved for documentation.  Findings:Moderate right hydronephrosis.  The catheter was placed through a lower pole calix.  Catheter was reconstituted in the renal pelvis.  Complications: None  Impression:Successful placement of a right percutaneous nephrostomy tube with ultrasound and fluoroscopic guidance.   Original Report Authenticated By: Richarda Overlie, M.D.     Scheduled Meds:   . sodium chloride   Intravenous STAT  . amLODipine  10 mg Oral Daily  . bicalutamide  50 mg Oral Daily  . ciprofloxacin  400 mg Intravenous On Call  . diphenhydrAMINE  25 mg Intravenous Once  . fentaNYL      . insulin aspart  0-5 Units Subcutaneous QHS  . insulin aspart  0-9 Units Subcutaneous TID WC  . iohexol  20 mL Oral Q1 Hr x 2  . midazolam      . multivitamin with minerals  1 tablet Oral Daily  . sodium chloride  3 mL Intravenous Q12H  . DISCONTD: multivitamin  1 tablet Oral Daily   Continuous Infusions:   . sodium chloride 20 mL/hr (09/24/11 1415)    Principal Problem:  *Acute renal failure Active Problems:  Diet-controlled type 2 diabetes mellitus  HYPERTENSION  Hydronephrosis

## 2011-09-24 NOTE — H&P (Signed)
Francisco Dunn is an 61 y.o. male.   Chief Complaint: hx prostate ca; increasing bun/cr level Backpain/rt flank pain CT reveals Rt Hydronephrosis Scheduled for Rt percutaneous nephrostomy placement now HPI: DM; prostate ca; HTN  Past Medical History  Diagnosis Date  . Hand numbness   . Carpal tunnel syndrome     right  . Diabetes type 2, controlled     diet controlled  . Mixed hyperlipidemia   . HTN (hypertension)   . Prostate cancer     Dr. Altamese [place] - advanced prostate cancer, Gleason 5+4, on lupron per Dr. Renae Fickle (onc)    Past Surgical History  Procedure Date  . Tonsillectomy 1960's  . Wisdom tooth extraction 1968  . Carpal tunnel release 10/12/06    Right    Family History  Problem Relation Age of Onset  . Coronary artery disease Mother     PTCA  . Healthy Father   . Thyroid disease Brother   . Heart attack Maternal Grandfather   . Heart attack Paternal Grandfather   . Heart attack Paternal Grandmother   . Heart attack Paternal Aunt   . Diabetes      Both sides  . Prostate cancer Neg Hx   . Colon cancer Neg Hx   . Breast cancer Neg Hx    Social History:  reports that he has been smoking Cigarettes.  He has a 10 pack-year smoking history. He has never used smokeless tobacco. He reports that he does not drink alcohol or use illicit drugs.  Allergies:  Allergies  Allergen Reactions  . Lisinopril Other (See Comments)    creatinine elevation 1.4->2.0    Medications Prior to Admission  Medication Sig Dispense Refill  . amLODipine (NORVASC) 10 MG tablet Take 1 tablet (10 mg total) by mouth daily.  90 tablet  3  . GARLIC PO Take 1 capsule by mouth daily.       Marland Kitchen leuprolide (LUPRON) 22.5 MG injection Inject 22.5 mg into the muscle every 3 (three) months.      . Multiple Vitamin (MULTIVITAMIN) tablet Take 1 tablet by mouth daily.        . Pyridoxine HCl (VITAMIN B-6 PO) Take 1 tablet by mouth daily.        . vitamin C (ASCORBIC ACID) 500 MG tablet Take 500 mg by  mouth daily.        Results for orders placed during the hospital encounter of 09/23/11 (from the past 48 hour(s))  CBC WITH DIFFERENTIAL     Status: Abnormal   Collection Time   09/23/11 11:19 AM      Component Value Range Comment   WBC 11.6 (*) 4.0 - 10.5 K/uL    RBC 4.76  4.22 - 5.81 MIL/uL    Hemoglobin 14.1  13.0 - 17.0 g/dL    HCT 16.1  09.6 - 04.5 %    MCV 84.2  78.0 - 100.0 fL    MCH 29.6  26.0 - 34.0 pg    MCHC 35.2  30.0 - 36.0 g/dL    RDW 40.9  81.1 - 91.4 %    Platelets 230  150 - 400 K/uL    Neutrophils Relative 71  43 - 77 %    Neutro Abs 8.2 (*) 1.7 - 7.7 K/uL    Lymphocytes Relative 16  12 - 46 %    Lymphs Abs 1.8  0.7 - 4.0 K/uL    Monocytes Relative 11  3 - 12 %  Monocytes Absolute 1.3 (*) 0.1 - 1.0 K/uL    Eosinophils Relative 1  0 - 5 %    Eosinophils Absolute 0.2  0.0 - 0.7 K/uL    Basophils Relative 0  0 - 1 %    Basophils Absolute 0.0  0.0 - 0.1 K/uL   COMPREHENSIVE METABOLIC PANEL     Status: Abnormal   Collection Time   09/23/11 11:19 AM      Component Value Range Comment   Sodium 137  135 - 145 mEq/L    Potassium 4.1  3.5 - 5.1 mEq/L    Chloride 102  96 - 112 mEq/L    CO2 23  19 - 32 mEq/L    Glucose, Bld 140 (*) 70 - 99 mg/dL    BUN 54 (*) 6 - 23 mg/dL    Creatinine, Ser 4.78 (*) 0.50 - 1.35 mg/dL    Calcium 9.1  8.4 - 29.5 mg/dL    Total Protein 7.5  6.0 - 8.3 g/dL    Albumin 3.7  3.5 - 5.2 g/dL    AST 18  0 - 37 U/L    ALT 18  0 - 53 U/L    Alkaline Phosphatase 81  39 - 117 U/L    Total Bilirubin 0.3  0.3 - 1.2 mg/dL    GFR calc non Af Amer 19 (*) >90 mL/min    GFR calc Af Amer 22 (*) >90 mL/min   URINALYSIS, ROUTINE W REFLEX MICROSCOPIC     Status: Abnormal   Collection Time   09/23/11  3:10 PM      Component Value Range Comment   Color, Urine YELLOW  YELLOW    APPearance CLEAR  CLEAR    Specific Gravity, Urine 1.015  1.005 - 1.030    pH 5.0  5.0 - 8.0    Glucose, UA NEGATIVE  NEGATIVE mg/dL    Hgb urine dipstick TRACE (*) NEGATIVE     Bilirubin Urine NEGATIVE  NEGATIVE    Ketones, ur NEGATIVE  NEGATIVE mg/dL    Protein, ur 30 (*) NEGATIVE mg/dL    Urobilinogen, UA 0.2  0.0 - 1.0 mg/dL    Nitrite NEGATIVE  NEGATIVE    Leukocytes, UA NEGATIVE  NEGATIVE   URINE MICROSCOPIC-ADD ON     Status: Normal   Collection Time   09/23/11  3:10 PM      Component Value Range Comment   WBC, UA 0-2  <3 WBC/hpf    RBC / HPF 0-2  <3 RBC/hpf   HEMOGLOBIN A1C     Status: Abnormal   Collection Time   09/23/11  8:31 PM      Component Value Range Comment   Hemoglobin A1C 6.4 (*) <5.7 %    Mean Plasma Glucose 137 (*) <117 mg/dL   PSA     Status: Abnormal   Collection Time   09/23/11  8:42 PM      Component Value Range Comment   PSA 21.13 (*) <=4.00 ng/mL   GLUCOSE, CAPILLARY     Status: Abnormal   Collection Time   09/24/11 12:18 AM      Component Value Range Comment   Glucose-Capillary 126 (*) 70 - 99 mg/dL   BASIC METABOLIC PANEL     Status: Abnormal   Collection Time   09/24/11  6:48 AM      Component Value Range Comment   Sodium 137  135 - 145 mEq/L    Potassium 4.7  3.5 - 5.1  mEq/L    Chloride 101  96 - 112 mEq/L    CO2 25  19 - 32 mEq/L    Glucose, Bld 117 (*) 70 - 99 mg/dL    BUN 49 (*) 6 - 23 mg/dL    Creatinine, Ser 0.98 (*) 0.50 - 1.35 mg/dL    Calcium 8.7  8.4 - 11.9 mg/dL    GFR calc non Af Amer 18 (*) >90 mL/min    GFR calc Af Amer 20 (*) >90 mL/min   GLUCOSE, CAPILLARY     Status: Abnormal   Collection Time   09/24/11  8:11 AM      Component Value Range Comment   Glucose-Capillary 123 (*) 70 - 99 mg/dL    Comment 1 Notify RN      Ct Abdomen Pelvis Wo Contrast  09/24/2011  *RADIOLOGY REPORT*  Clinical Data: New right hydronephrosis and abnormal BUN and creatinine in a patient with history of prostate cancer.  CT ABDOMEN AND PELVIS WITHOUT CONTRAST  Technique:  Multidetector CT imaging of the abdomen and pelvis was performed following the standard protocol without intravenous contrast.  Comparison: Ultrasound  renal 09/23/2011.  Findings: Linear atelectasis in the lung bases.  There is a right renal pyelocaliectasis and ureterectasis with pararenal and periureteral stranding.  Changes are consistent with obstruction or infection.  No discrete ureteral stone is demonstrated.  There is a small fleck of calcification in the decompressed bladder which could represent a bladder stone although this is located more towards the left side.  Extrinsic compression and / or infection may be present.  There is evidence of a cyst in the right kidney.  The left renal collecting system and ureter are decompressed.  The bladder is decompressed with Foley catheter. There is suggestion of diffuse bladder wall thickening and stranding in the fat around the bladder.  There is a lobular mass between the rectum and bladder measuring 5.2 x 3.8 cm with multiple nodular changes throughout the deep pelvic fat along the iliac chains bilaterally.  This is likely related to the history of prostate cancer with lymph node metastasis.  There is a focal circumscribed low attenuation lesion in the posterior aspect the right lobe of the liver measuring 15 mm diameter.  This is indeterminate.  There is somewhat geographic low attenuation throughout the liver consistent with focal fatty infiltration.  The unenhanced gallbladder, spleen, pancreas, right adrenal gland, and inferior vena cava are unremarkable.  There is enlargement of the left adrenal gland measuring 60 mm which could represent hyperplasia or an adenoma.  Calcification of the abdominal aorta without aneurysm.  Diffuse retroperitoneal lymphadenopathy involving pericaval and periaortic nodes consistent with metastases.  The stomach and small bowel are decompressed. Stool filled colon without distension or wall thickening.  No free air or free fluid in the abdomen.  Pelvis:  In addition present scattered findings, the appendix is normal and there is no evidence of diverticulitis.  Degenerative  changes in the lumbar spine.  Focal sclerosis and mild anterior compression in the anterior body of T10 is likely metastatic. Focal sclerosis in the anterior inferior body of the L4 is also likely metastatic.  IMPRESSION: Lobulated mass in the pelvis and rectum and bladder suggesting prostate cancer with multiple lymph node metastases throughout the pelvis and retroperitoneum.  Sclerotic bone metastases.  The right renal hydronephrosis and ureterectasis with pararenal and periureteral stranding suggesting obstruction and / or infection. No obstructing stone is demonstrated.  Changes may represent extrinsic compression.  Original Report Authenticated By: Marlon Pel, M.D.    US Renal  09/23/2011  *RADIOLOGY REPORT*  Clinical Data: Elevated creatinine.  Prostate cancer and hypertension  RENAL/URINARY TRACT ULTRASOUND COMPLETE  Comparison:  None.  Findings:  Right Kidney:  13.6 cm.  Moderate right-sided hydronephrosis.  2.5 cm cyst mid kidney.  Otherwise normal cortex.  Left Kidney:  12.1 cm in length.  Negative for hydronephrosis. Renal cortex is normal.  2 cm cyst lower pole.  Bladder:  Empty urinary bladder with a Foley catheter.  IMPRESSION: Moderate right hydronephrosis.  No obstruction of the left kidney.   Original Report Authenticated By: Camelia Phenes, M.D.     Review of Systems  Constitutional: Negative for fever.  Respiratory: Negative for shortness of breath.   Cardiovascular: Negative for chest pain.  Gastrointestinal: Positive for abdominal pain. Negative for nausea and vomiting.  Genitourinary: Positive for flank pain.       Rt flank pain  Neurological: Negative for headaches.    Blood pressure 122/72, pulse 66, temperature 98.3 F (36.8 C), temperature source Oral, resp. rate 18, height 6' (1.829 m), weight 225 lb 12 oz (102.4 kg), SpO2 98.00%. Physical Exam   Assessment/Plan Hx prostate ca Worsening renal fxn; CT shows Rt hydro Scheduled now for R PCN Pt aware of  procedure benefits and risks and agreeable to proceed Consent in chart/ signed  Hilma Steinhilber A 09/24/2011, 10:16 AM

## 2011-09-24 NOTE — ED Notes (Signed)
Dressing and drain checked with Kisha RN on [address] Is clean dry and intact and draining rose colored urine.

## 2011-09-24 NOTE — Procedures (Signed)
Placement of right percutaneous nephrostomy.  No immediate complication.

## 2011-09-25 DIAGNOSIS — I10 Essential (primary) hypertension: Secondary | ICD-10-CM

## 2011-09-25 LAB — GLUCOSE, CAPILLARY: Glucose-Capillary: 116 mg/dL — ABNORMAL HIGH (ref 70–99)

## 2011-09-25 LAB — BASIC METABOLIC PANEL
CO2: 23 mEq/L (ref 19–32)
GFR calc non Af Amer: 23 mL/min — ABNORMAL LOW (ref >90)
Glucose, Bld: 123 mg/dL — ABNORMAL HIGH (ref 70–99)
Potassium: 4.5 mEq/L (ref 3.5–5.1)
Sodium: 141 mEq/L (ref 135–145)

## 2011-09-25 MED ORDER — HYDROCODONE-ACETAMINOPHEN 5-325 MG PO TABS: 1.0000 | ORAL_TABLET | Freq: Four times a day (QID) | ORAL | Status: AC | PRN

## 2011-09-25 MED ORDER — BICALUTAMIDE 50 MG PO TABS: 50.0000 mg | ORAL_TABLET | Freq: Every day | ORAL | Status: DC

## 2011-09-25 NOTE — Discharge Planning (Signed)
Patient discharged home in stable condition. Verbalizes understanding of all discharge instructions, including home medications and follow up appointments. 

## 2011-09-25 NOTE — Discharge Summary (Signed)
Francisco Dunn MRN: [id number] DOB/AGE: 04/16/1950 61 y.o.  Admit date: 09/23/2011 Discharge date: 09/25/2011  Primary Care Physician:  Eustaquio Boyden, MD   Discharge Diagnoses:   Patient Active Problem List  Diagnosis  . Diet-controlled type 2 diabetes mellitus  . HYPERLIPIDEMIA, MIXED  . CARPAL TUNNEL SYNDROME, RIGHT  . HYPERTENSION  . SEBACEOUS CYST  . NUMBNESS, HAND  . Prostate cancer  . DDD (degenerative disc disease), lumbar  . Healthcare maintenance  . Renal insufficiency  . Smoker  . Acute renal failure  . Hydronephrosis    DISCHARGE MEDICATION: Medication List  As of 09/25/2011  2:29 PM   STOP taking these medications         POTASSIUM GLUCONATE PO         TAKE these medications         amLODipine 10 MG tablet   Commonly known as: NORVASC   Take 1 tablet (10 mg total) by mouth daily.      bicalutamide 50 MG tablet   Commonly known as: CASODEX   Take 1 tablet (50 mg total) by mouth daily.      GARLIC PO   Take 1 capsule by mouth daily.      HYDROcodone-acetaminophen 5-325 MG per tablet   Commonly known as: NORCO/VICODIN   Take 1-2 tablets by mouth every 6 (six) hours as needed.      leuprolide 22.5 MG injection   Commonly known as: LUPRON   Inject 22.5 mg into the muscle every 3 (three) months.      multivitamin tablet   Take 1 tablet by mouth daily.      VITAMIN B-6 PO   Take 1 tablet by mouth daily.      vitamin C 500 MG tablet   Commonly known as: ASCORBIC ACID   Take 500 mg by mouth daily.              Consults: Treatment Team:  Garnett Farm, MD   SIGNIFICANT DIAGNOSTIC STUDIES:  Ct Abdomen Pelvis Wo Contrast  09/24/2011  *RADIOLOGY REPORT*  Clinical Data: New right hydronephrosis and abnormal BUN and creatinine in a patient with history of prostate cancer.  CT ABDOMEN AND PELVIS WITHOUT CONTRAST  Technique:  Multidetector CT imaging of the abdomen and pelvis was performed following the standard protocol without intravenous  contrast.  Comparison: Ultrasound renal 09/23/2011.  Findings: Linear atelectasis in the lung bases.  There is a right renal pyelocaliectasis and ureterectasis with pararenal and periureteral stranding.  Changes are consistent with obstruction or infection.  No discrete ureteral stone is demonstrated.  There is a small fleck of calcification in the decompressed bladder which could represent a bladder stone although this is located more towards the left side.  Extrinsic compression and / or infection may be present.  There is evidence of a cyst in the right kidney.  The left renal collecting system and ureter are decompressed.  The bladder is decompressed with Foley catheter. There is suggestion of diffuse bladder wall thickening and stranding in the fat around the bladder.  There is a lobular mass between the rectum and bladder measuring 5.2 x 3.8 cm with multiple nodular changes throughout the deep pelvic fat along the iliac chains bilaterally.  This is likely related to the history of prostate cancer with lymph node metastasis.  There is a focal circumscribed low attenuation lesion in the posterior aspect the right lobe of the liver measuring 15 mm diameter.  This is indeterminate.  There is  somewhat geographic low attenuation throughout the liver consistent with focal fatty infiltration.  The unenhanced gallbladder, spleen, pancreas, right adrenal gland, and inferior vena cava are unremarkable.  There is enlargement of the left adrenal gland measuring 60 mm which could represent hyperplasia or an adenoma.  Calcification of the abdominal aorta without aneurysm.  Diffuse retroperitoneal lymphadenopathy involving pericaval and periaortic nodes consistent with metastases.  The stomach and small bowel are decompressed. Stool filled colon without distension or wall thickening.  No free air or free fluid in the abdomen.  Pelvis:  In addition present scattered findings, the appendix is normal and there is no evidence of  diverticulitis.  Degenerative changes in the lumbar spine.  Focal sclerosis and mild anterior compression in the anterior body of T10 is likely metastatic. Focal sclerosis in the anterior inferior body of the L4 is also likely metastatic.  IMPRESSION: Lobulated mass in the pelvis and rectum and bladder suggesting prostate cancer with multiple lymph node metastases throughout the pelvis and retroperitoneum.  Sclerotic bone metastases.  The right renal hydronephrosis and ureterectasis with pararenal and periureteral stranding suggesting obstruction and / or infection. No obstructing stone is demonstrated.  Changes may represent extrinsic compression.   Original Report Authenticated By: Marlon Pel, M.D.    US Renal  09/23/2011  *RADIOLOGY REPORT*  Clinical Data: Elevated creatinine.  Prostate cancer and hypertension  RENAL/URINARY TRACT ULTRASOUND COMPLETE  Comparison:  None.  Findings:  Right Kidney:  13.6 cm.  Moderate right-sided hydronephrosis.  2.5 cm cyst mid kidney.  Otherwise normal cortex.  Left Kidney:  12.1 cm in length.  Negative for hydronephrosis. Renal cortex is normal.  2 cm cyst lower pole.  Bladder:  Empty urinary bladder with a Foley catheter.  IMPRESSION: Moderate right hydronephrosis.  No obstruction of the left kidney.   Original Report Authenticated By: Camelia Phenes, M.D.    Ir Perc Nephrostomy Right  09/24/2011  *RADIOLOGY REPORT*  Clinical history:61 year old with prostate cancer and right hydronephrosis.  The patient has acute renal failure.  PROCEDURE(S): ULTRASOUND AND FLUOROSCOPIC GUIDED RIGHT PERCUTANEOUS NEPHROSTOMY TUBE PLACEMENT  Physician: Rachelle Hora. Lowella Dandy, MD  Medications:Ciprofloxacin 400 mg.  Versed 1.5 mg, Fentanyl 75 mcg. A radiology nurse monitored the patient for moderate sedation.  Ciprofloxacin was given within two hours of incision.  Moderate sedation time:20 minutes  Fluoroscopy time: 0.7  Procedure:The procedure was explained to the patient.  The risks and  benefits of the procedure were discussed and the patient's questions were addressed.  Informed consent was obtained from the patient.  The patient was placed prone on the interventional table. Ultrasound demonstrated moderate right hydronephrosis.  The right flank was prepped and draped in a sterile fashion.  Maximal barrier sterile technique was utilized including caps, mask, sterile gowns, sterile gloves, sterile drape, hand hygiene and skin antiseptic. Skin was anesthetized with lidocaine.  22 gauge Chiba needle was directed into a lower pole calix with ultrasound guidance.  Urine was draining from the needle.  A 0.018 wire was advanced into the renal pelvis.  An Accustick dilator set was placed and contrast was injected to confirm placement in the renal collecting system.  A J wire was advanced into the proximal ureter and the tract was dilated to 10-French.  10.2 [place] multipurpose drain was reconstituted in the renal pelvis.  Catheter was sutured to the skin.  Small amount of contrast was injected to confirm placement in the renal pelvis.  Contrast was aspirated.  Drain was attached to a  gravity bag.Fluoroscopic and ultrasound images were taken and saved for documentation.  Findings:Moderate right hydronephrosis.  The catheter was placed through a lower pole calix.  Catheter was reconstituted in the renal pelvis.  Complications: None  Impression:Successful placement of a right percutaneous nephrostomy tube with ultrasound and fluoroscopic guidance.   Original Report Authenticated By: Richarda Overlie, M.D.    Ir US Guide Bx Asp/drain  09/24/2011  *RADIOLOGY REPORT*  Clinical history:61 year old with prostate cancer and right hydronephrosis.  The patient has acute renal failure.  PROCEDURE(S): ULTRASOUND AND FLUOROSCOPIC GUIDED RIGHT PERCUTANEOUS NEPHROSTOMY TUBE PLACEMENT  Physician: Rachelle Hora. Lowella Dandy, MD  Medications:Ciprofloxacin 400 mg.  Versed 1.5 mg, Fentanyl 75 mcg. A radiology nurse monitored the patient for  moderate sedation.  Ciprofloxacin was given within two hours of incision.  Moderate sedation time:20 minutes  Fluoroscopy time: 0.7  Procedure:The procedure was explained to the patient.  The risks and benefits of the procedure were discussed and the patient's questions were addressed.  Informed consent was obtained from the patient.  The patient was placed prone on the interventional table. Ultrasound demonstrated moderate right hydronephrosis.  The right flank was prepped and draped in a sterile fashion.  Maximal barrier sterile technique was utilized including caps, mask, sterile gowns, sterile gloves, sterile drape, hand hygiene and skin antiseptic. Skin was anesthetized with lidocaine.  22 gauge Chiba needle was directed into a lower pole calix with ultrasound guidance.  Urine was draining from the needle.  A 0.018 wire was advanced into the renal pelvis.  An Accustick dilator set was placed and contrast was injected to confirm placement in the renal collecting system.  A J wire was advanced into the proximal ureter and the tract was dilated to 10-French.  10.2 [place] multipurpose drain was reconstituted in the renal pelvis.  Catheter was sutured to the skin.  Small amount of contrast was injected to confirm placement in the renal pelvis.  Contrast was aspirated.  Drain was attached to a gravity bag.Fluoroscopic and ultrasound images were taken and saved for documentation.  Findings:Moderate right hydronephrosis.  The catheter was placed through a lower pole calix.  Catheter was reconstituted in the renal pelvis.  Complications: None  Impression:Successful placement of a right percutaneous nephrostomy tube with ultrasound and fluoroscopic guidance.   Original Report Authenticated By: Richarda Overlie, M.D.           OTHER PROCEDURES: S/P Right Nephrostomy tube placement    BRIEF ADMITTING H & P: Pt seen by PMD a few days ago and had labs drawn which showed progressive worsening of renal function. He was  asked to come to the ED for further evaluation. Historically the patient has been on Lisinopril which was discontinued 8 days ago, but has still had a continued rise in creatinine.  Pt states that he has been feeling well and has had no complaints. He denies any CP, dizziness, SOB, difficulty breathing. The patient is on active treatment for prostate cancer and states that since treatment started, he has been having frequency and urgency.  Patient wants to go home. I have explained that we need to ensure that he does not have a hydronephrosis before discharging him.The patient is willing to wait to have a renal ultrasound.     Hospital Course:  Present on Admission:  .Hydronephrosis: PT admitted with ARF and found to have right hydronephrosis on renal ultrasound. This is felt secondary to advance urology was consult and recommended a nephrostomy to be placed in the right kidney. Subsequent  to the placement of the nephrostomy 2 the patient had an improvement in his renal function. He was seen this morning by urology who recommended the patient be discharged with nephrostomy tube in followup as an outpatient with urology for further consideration of stenting.  .Acute renal failure: Patient admitted with the renal failure secondary to acute hydronephrosis. The renal function has already started him process nephrostomy tube placed. Recommend the patient have his renal function evaluated approximately 3-5 days to ensure that the renal function is improving. I refer patient either back to his primary care physician or to the urologist whichever appointment he is able to obtain first.   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 Cardiology he's had a rapidly increasing PSA was found to be 21.13 on this admission. 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  Marland KitchenHYPERTENSION: She well controlled  on Norvasc  .Diet-controlled type 2 diabetes mellitus: Blood sugars well-controlled    Disposition and Follow-up:  Pt to follow up with Urology per instructions from Dr. Retta Diones. Pt to F/U with Dr. Sharen Hones in 1 week. Check renal function in 3 days. Pt has been instructed in and his wife has demonstrated proficiency in the care of the nephrostomy tube.   Discharge Orders    Future Appointments: Provider: Department: Dept Phone: Center:   02/29/2012 9:00 AM Eustaquio Boyden, MD [facility] [phone] [id number]      DISCHARGE EXAM:  General: Alert, awake, oriented x3, in no acute distress.  Vital Signs:Blood pressure 135/75, pulse 67, temperature 98.3 F (36.8 C), temperature source Oral, resp. rate 18, height 6' (1.829 m), weight 102.4 kg (225 lb 12 oz), SpO2 99.00%. HEENT: [place]/AT PEERL, EOMI  OROPHARYNX: Moist, No exudate/ erythema/lesions.  Heart: Regular rate and rhythm, without murmurs, rubs, gallops.  Lungs: Clear to auscultation, no wheezing or rhonchi noted.  Abdomen: Soft, nontender, nondistended, positive bowel sounds, no masses no hepatosplenomegaly noted. Right-sided nephrostomy tube in place and draining well    Basename 09/25/11 0515 09/24/11 0648  NA 141 137  K 4.5 4.7  CL 106 101  CO2 23 25  GLUCOSE 123* 117*  BUN 49* 49*  CREATININE 2.82* 3.46*  CALCIUM 9.2 8.7  MG -- --  PHOS -- --    Basename 09/23/11 1119  AST 18  ALT 18  ALKPHOS 81  BILITOT 0.3  PROT 7.5  ALBUMIN 3.7   No results found for this basename: LIPASE:2,AMYLASE:2 in the last 72 hours  Basename 09/23/11 1119  WBC 11.6*  NEUTROABS 8.2*  HGB 14.1  HCT 40.1  MCV 84.2  PLT 230   Total time for this discharge including face to face time > 30 minutes. Signed: Elisandra Deshmukh A. 09/25/2011, 2:29 PM

## 2011-09-25 NOTE — Progress Notes (Signed)
Subjective: Patient reports no pain-feeling fine  Objective: Vital signs in last 24 hours: Temp:  [97.7 F (36.5 C)-98.8 F (37.1 C)] 98.3 F (36.8 C) (08/24 0621) Pulse Rate:  [67-85] 67  (08/24 0621) Resp:  [13-20] 18  (08/24 0621) BP: (135-153)/(72-91) 135/75 mmHg (08/24 0621) SpO2:  [95 %-100 %] 99 % (08/24 0621)  Intake/Output from previous day: 08/23 0701 - 08/24 0700 In: 3 [I.V.:3] Out: 3575 [Urine:1850; Drains:1725] Intake/Output this shift: Total I/O In: -  Out: 375 [Urine:125; Drains:250]  Physical Exam:  Constitutional: Vital signs reviewed. WD WN in NAD    UOP excellent    Lab Results:  Basename 09/23/11 1119  HGB 14.1  HCT 40.1   BMET  Basename 09/25/11 0515 09/24/11 0648  NA 141 137  K 4.5 4.7  CL 106 101  CO2 23 25  GLUCOSE 123* 117*  BUN 49* 49*  CREATININE 2.82* 3.46*  CALCIUM 9.2 8.7    Basename 09/24/11 1035  LABPT --  INR 1.13   No results found for this basename: LABURIN:1 in the last 72 hours No results found for this or any previous visit.  Studies/Results: Ct Abdomen Pelvis Wo Contrast  09/24/2011  *RADIOLOGY REPORT*  Clinical Data: New right hydronephrosis and abnormal BUN and creatinine in a patient with history of prostate cancer.  CT ABDOMEN AND PELVIS WITHOUT CONTRAST  Technique:  Multidetector CT imaging of the abdomen and pelvis was performed following the standard protocol without intravenous contrast.  Comparison: Ultrasound renal 09/23/2011.  Findings: Linear atelectasis in the lung bases.  There is a right renal pyelocaliectasis and ureterectasis with pararenal and periureteral stranding.  Changes are consistent with obstruction or infection.  No discrete ureteral stone is demonstrated.  There is a small fleck of calcification in the decompressed bladder which could represent a bladder stone although this is located more towards the left side.  Extrinsic compression and / or infection may be present.  There is evidence of  a cyst in the right kidney.  The left renal collecting system and ureter are decompressed.  The bladder is decompressed with Foley catheter. There is suggestion of diffuse bladder wall thickening and stranding in the fat around the bladder.  There is a lobular mass between the rectum and bladder measuring 5.2 x 3.8 cm with multiple nodular changes throughout the deep pelvic fat along the iliac chains bilaterally.  This is likely related to the history of prostate cancer with lymph node metastasis.  There is a focal circumscribed low attenuation lesion in the posterior aspect the right lobe of the liver measuring 15 mm diameter.  This is indeterminate.  There is somewhat geographic low attenuation throughout the liver consistent with focal fatty infiltration.  The unenhanced gallbladder, spleen, pancreas, right adrenal gland, and inferior vena cava are unremarkable.  There is enlargement of the left adrenal gland measuring 60 mm which could represent hyperplasia or an adenoma.  Calcification of the abdominal aorta without aneurysm.  Diffuse retroperitoneal lymphadenopathy involving pericaval and periaortic nodes consistent with metastases.  The stomach and small bowel are decompressed. Stool filled colon without distension or wall thickening.  No free air or free fluid in the abdomen.  Pelvis:  In addition present scattered findings, the appendix is normal and there is no evidence of diverticulitis.  Degenerative changes in the lumbar spine.  Focal sclerosis and mild anterior compression in the anterior body of T10 is likely metastatic. Focal sclerosis in the anterior inferior body of the L4 is also  likely metastatic.  IMPRESSION: Lobulated mass in the pelvis and rectum and bladder suggesting prostate cancer with multiple lymph node metastases throughout the pelvis and retroperitoneum.  Sclerotic bone metastases.  The right renal hydronephrosis and ureterectasis with pararenal and periureteral stranding suggesting  obstruction and / or infection. No obstructing stone is demonstrated.  Changes may represent extrinsic compression.   Original Report Authenticated By: Marlon Pel, M.D.    US Renal  09/23/2011  *RADIOLOGY REPORT*  Clinical Data: Elevated creatinine.  Prostate cancer and hypertension  RENAL/URINARY TRACT ULTRASOUND COMPLETE  Comparison:  None.  Findings:  Right Kidney:  13.6 cm.  Moderate right-sided hydronephrosis.  2.5 cm cyst mid kidney.  Otherwise normal cortex.  Left Kidney:  12.1 cm in length.  Negative for hydronephrosis. Renal cortex is normal.  2 cm cyst lower pole.  Bladder:  Empty urinary bladder with a Foley catheter.  IMPRESSION: Moderate right hydronephrosis.  No obstruction of the left kidney.   Original Report Authenticated By: Camelia Phenes, M.D.    Ir Perc Nephrostomy Right  09/24/2011  *RADIOLOGY REPORT*  Clinical history:61 year old with prostate cancer and right hydronephrosis.  The patient has acute renal failure.  PROCEDURE(S): ULTRASOUND AND FLUOROSCOPIC GUIDED RIGHT PERCUTANEOUS NEPHROSTOMY TUBE PLACEMENT  Physician: Rachelle Hora. Lowella Dandy, MD  Medications:Ciprofloxacin 400 mg.  Versed 1.5 mg, Fentanyl 75 mcg. A radiology nurse monitored the patient for moderate sedation.  Ciprofloxacin was given within two hours of incision.  Moderate sedation time:20 minutes  Fluoroscopy time: 0.7  Procedure:The procedure was explained to the patient.  The risks and benefits of the procedure were discussed and the patient's questions were addressed.  Informed consent was obtained from the patient.  The patient was placed prone on the interventional table. Ultrasound demonstrated moderate right hydronephrosis.  The right flank was prepped and draped in a sterile fashion.  Maximal barrier sterile technique was utilized including caps, mask, sterile gowns, sterile gloves, sterile drape, hand hygiene and skin antiseptic. Skin was anesthetized with lidocaine.  22 gauge Chiba needle was directed into a lower  pole calix with ultrasound guidance.  Urine was draining from the needle.  A 0.018 wire was advanced into the renal pelvis.  An Accustick dilator set was placed and contrast was injected to confirm placement in the renal collecting system.  A J wire was advanced into the proximal ureter and the tract was dilated to 10-French.  10.2 [place] multipurpose drain was reconstituted in the renal pelvis.  Catheter was sutured to the skin.  Small amount of contrast was injected to confirm placement in the renal pelvis.  Contrast was aspirated.  Drain was attached to a gravity bag.Fluoroscopic and ultrasound images were taken and saved for documentation.  Findings:Moderate right hydronephrosis.  The catheter was placed through a lower pole calix.  Catheter was reconstituted in the renal pelvis.  Complications: None  Impression:Successful placement of a right percutaneous nephrostomy tube with ultrasound and fluoroscopic guidance.   Original Report Authenticated By: Richarda Overlie, M.D.    Ir US Guide Bx Asp/drain  09/24/2011  *RADIOLOGY REPORT*  Clinical history:61 year old with prostate cancer and right hydronephrosis.  The patient has acute renal failure.  PROCEDURE(S): ULTRASOUND AND FLUOROSCOPIC GUIDED RIGHT PERCUTANEOUS NEPHROSTOMY TUBE PLACEMENT  Physician: Rachelle Hora. Lowella Dandy, MD  Medications:Ciprofloxacin 400 mg.  Versed 1.5 mg, Fentanyl 75 mcg. A radiology nurse monitored the patient for moderate sedation.  Ciprofloxacin was given within two hours of incision.  Moderate sedation time:20 minutes  Fluoroscopy time: 0.7  Procedure:The procedure  was explained to the patient.  The risks and benefits of the procedure were discussed and the patient's questions were addressed.  Informed consent was obtained from the patient.  The patient was placed prone on the interventional table. Ultrasound demonstrated moderate right hydronephrosis.  The right flank was prepped and draped in a sterile fashion.  Maximal barrier sterile technique  was utilized including caps, mask, sterile gowns, sterile gloves, sterile drape, hand hygiene and skin antiseptic. Skin was anesthetized with lidocaine.  22 gauge Chiba needle was directed into a lower pole calix with ultrasound guidance.  Urine was draining from the needle.  A 0.018 wire was advanced into the renal pelvis.  An Accustick dilator set was placed and contrast was injected to confirm placement in the renal collecting system.  A J wire was advanced into the proximal ureter and the tract was dilated to 10-French.  10.2 [place] multipurpose drain was reconstituted in the renal pelvis.  Catheter was sutured to the skin.  Small amount of contrast was injected to confirm placement in the renal pelvis.  Contrast was aspirated.  Drain was attached to a gravity bag.Fluoroscopic and ultrasound images were taken and saved for documentation.  Findings:Moderate right hydronephrosis.  The catheter was placed through a lower pole calix.  Catheter was reconstituted in the renal pelvis.  Complications: None  Impression:Successful placement of a right percutaneous nephrostomy tube with ultrasound and fluoroscopic guidance.   Original Report Authenticated By: Richarda Overlie, M.D.     Assessment/Plan:   Pt improving/Cr better.  OK to d/c after he voids--I removed foley just now.  Pt to f/u w/ Oncologist in [place], wants to see Dr. Vernie Ammons rather than Garner Nash. He will call for appt.  Will be candidate for internalization of stent--we will arrange  I sent in scrip for bicalutamide/Casodex  30 mins spent w/ pt/wife answering questions   LOS: 2 days   Marcine Matar M 09/25/2011, 1:13 PM

## 2011-09-25 NOTE — Progress Notes (Signed)
Subjective: Rt PCN placed 8/23 Pt doing well Output great  Objective: Vital signs in last 24 hours: Temp:  [97.7 F (36.5 C)-98.8 F (37.1 C)] 98.3 F (36.8 C) (08/24 0621) Pulse Rate:  [67-85] 67  (08/24 0621) Resp:  [13-20] 18  (08/24 0621) BP: (135-153)/(72-91) 135/75 mmHg (08/24 0621) SpO2:  [95 %-100 %] 99 % (08/24 0621) Last BM Date: 09/23/11  Intake/Output from previous day: 08/23 0701 - 08/24 0700 In: 3 [I.V.:3] Out: 3575 [Urine:1850; Drains:1725] Intake/Output this shift:    PE:  Afeb; vss Rt PCN intact; nt; no bleeding Clean and dry Output clear yellow 1.7 L yesterday; 350 cc in bag now Bun/cr: 49/2.8 (49/3.46)   Lab Results:   [facility] 09/23/11 1119  WBC 11.6*  HGB 14.1  HCT 40.1  PLT 230   BMET  Basename 09/25/11 0515 09/24/11 0648  NA 141 137  K 4.5 4.7  CL 106 101  CO2 23 25  GLUCOSE 123* 117*  BUN 49* 49*  CREATININE 2.82* 3.46*  CALCIUM 9.2 8.7   PT/INR  Basename 09/24/11 1035  LABPROT 14.7  INR 1.13   ABG No results found for this basename: PHART:2,PCO2:2,PO2:2,HCO3:2 in the last 72 hours  Studies/Results: Ct Abdomen Pelvis Wo Contrast  09/24/2011  *RADIOLOGY REPORT*  Clinical Data: New right hydronephrosis and abnormal BUN and creatinine in a patient with history of prostate cancer.  CT ABDOMEN AND PELVIS WITHOUT CONTRAST  Technique:  Multidetector CT imaging of the abdomen and pelvis was performed following the standard protocol without intravenous contrast.  Comparison: Ultrasound renal 09/23/2011.  Findings: Linear atelectasis in the lung bases.  There is a right renal pyelocaliectasis and ureterectasis with pararenal and periureteral stranding.  Changes are consistent with obstruction or infection.  No discrete ureteral stone is demonstrated.  There is a small fleck of calcification in the decompressed bladder which could represent a bladder stone although this is located more towards the left side.  Extrinsic compression and /  or infection may be present.  There is evidence of a cyst in the right kidney.  The left renal collecting system and ureter are decompressed.  The bladder is decompressed with Foley catheter. There is suggestion of diffuse bladder wall thickening and stranding in the fat around the bladder.  There is a lobular mass between the rectum and bladder measuring 5.2 x 3.8 cm with multiple nodular changes throughout the deep pelvic fat along the iliac chains bilaterally.  This is likely related to the history of prostate cancer with lymph node metastasis.  There is a focal circumscribed low attenuation lesion in the posterior aspect the right lobe of the liver measuring 15 mm diameter.  This is indeterminate.  There is somewhat geographic low attenuation throughout the liver consistent with focal fatty infiltration.  The unenhanced gallbladder, spleen, pancreas, right adrenal gland, and inferior vena cava are unremarkable.  There is enlargement of the left adrenal gland measuring 60 mm which could represent hyperplasia or an adenoma.  Calcification of the abdominal aorta without aneurysm.  Diffuse retroperitoneal lymphadenopathy involving pericaval and periaortic nodes consistent with metastases.  The stomach and small bowel are decompressed. Stool filled colon without distension or wall thickening.  No free air or free fluid in the abdomen.  Pelvis:  In addition present scattered findings, the appendix is normal and there is no evidence of diverticulitis.  Degenerative changes in the lumbar spine.  Focal sclerosis and mild anterior compression in the anterior body of T10 is likely metastatic. Focal  sclerosis in the anterior inferior body of the L4 is also likely metastatic.  IMPRESSION: Lobulated mass in the pelvis and rectum and bladder suggesting prostate cancer with multiple lymph node metastases throughout the pelvis and retroperitoneum.  Sclerotic bone metastases.  The right renal hydronephrosis and ureterectasis with  pararenal and periureteral stranding suggesting obstruction and / or infection. No obstructing stone is demonstrated.  Changes may represent extrinsic compression.   Original Report Authenticated By: Marlon Pel, M.D.    US Renal  09/23/2011  *RADIOLOGY REPORT*  Clinical Data: Elevated creatinine.  Prostate cancer and hypertension  RENAL/URINARY TRACT ULTRASOUND COMPLETE  Comparison:  None.  Findings:  Right Kidney:  13.6 cm.  Moderate right-sided hydronephrosis.  2.5 cm cyst mid kidney.  Otherwise normal cortex.  Left Kidney:  12.1 cm in length.  Negative for hydronephrosis. Renal cortex is normal.  2 cm cyst lower pole.  Bladder:  Empty urinary bladder with a Foley catheter.  IMPRESSION: Moderate right hydronephrosis.  No obstruction of the left kidney.   Original Report Authenticated By: Camelia Phenes, M.D.    Ir Perc Nephrostomy Right  09/24/2011  *RADIOLOGY REPORT*  Clinical history:61 year old with prostate cancer and right hydronephrosis.  The patient has acute renal failure.  PROCEDURE(S): ULTRASOUND AND FLUOROSCOPIC GUIDED RIGHT PERCUTANEOUS NEPHROSTOMY TUBE PLACEMENT  Physician: Rachelle Hora. Lowella Dandy, MD  Medications:Ciprofloxacin 400 mg.  Versed 1.5 mg, Fentanyl 75 mcg. A radiology nurse monitored the patient for moderate sedation.  Ciprofloxacin was given within two hours of incision.  Moderate sedation time:20 minutes  Fluoroscopy time: 0.7  Procedure:The procedure was explained to the patient.  The risks and benefits of the procedure were discussed and the patient's questions were addressed.  Informed consent was obtained from the patient.  The patient was placed prone on the interventional table. Ultrasound demonstrated moderate right hydronephrosis.  The right flank was prepped and draped in a sterile fashion.  Maximal barrier sterile technique was utilized including caps, mask, sterile gowns, sterile gloves, sterile drape, hand hygiene and skin antiseptic. Skin was anesthetized with lidocaine.   22 gauge Chiba needle was directed into a lower pole calix with ultrasound guidance.  Urine was draining from the needle.  A 0.018 wire was advanced into the renal pelvis.  An Accustick dilator set was placed and contrast was injected to confirm placement in the renal collecting system.  A J wire was advanced into the proximal ureter and the tract was dilated to 10-French.  10.2 [place] multipurpose drain was reconstituted in the renal pelvis.  Catheter was sutured to the skin.  Small amount of contrast was injected to confirm placement in the renal pelvis.  Contrast was aspirated.  Drain was attached to a gravity bag.Fluoroscopic and ultrasound images were taken and saved for documentation.  Findings:Moderate right hydronephrosis.  The catheter was placed through a lower pole calix.  Catheter was reconstituted in the renal pelvis.  Complications: None  Impression:Successful placement of a right percutaneous nephrostomy tube with ultrasound and fluoroscopic guidance.   Original Report Authenticated By: Richarda Overlie, M.D.    Ir US Guide Bx Asp/drain  09/24/2011  *RADIOLOGY REPORT*  Clinical history:61 year old with prostate cancer and right hydronephrosis.  The patient has acute renal failure.  PROCEDURE(S): ULTRASOUND AND FLUOROSCOPIC GUIDED RIGHT PERCUTANEOUS NEPHROSTOMY TUBE PLACEMENT  Physician: Rachelle Hora. Lowella Dandy, MD  Medications:Ciprofloxacin 400 mg.  Versed 1.5 mg, Fentanyl 75 mcg. A radiology nurse monitored the patient for moderate sedation.  Ciprofloxacin was given within two hours of incision.  Moderate sedation time:20 minutes  Fluoroscopy time: 0.7  Procedure:The procedure was explained to the patient.  The risks and benefits of the procedure were discussed and the patient's questions were addressed.  Informed consent was obtained from the patient.  The patient was placed prone on the interventional table. Ultrasound demonstrated moderate right hydronephrosis.  The right flank was prepped and draped in a  sterile fashion.  Maximal barrier sterile technique was utilized including caps, mask, sterile gowns, sterile gloves, sterile drape, hand hygiene and skin antiseptic. Skin was anesthetized with lidocaine.  22 gauge Chiba needle was directed into a lower pole calix with ultrasound guidance.  Urine was draining from the needle.  A 0.018 wire was advanced into the renal pelvis.  An Accustick dilator set was placed and contrast was injected to confirm placement in the renal collecting system.  A J wire was advanced into the proximal ureter and the tract was dilated to 10-French.  10.2 [place] multipurpose drain was reconstituted in the renal pelvis.  Catheter was sutured to the skin.  Small amount of contrast was injected to confirm placement in the renal pelvis.  Contrast was aspirated.  Drain was attached to a gravity bag.Fluoroscopic and ultrasound images were taken and saved for documentation.  Findings:Moderate right hydronephrosis.  The catheter was placed through a lower pole calix.  Catheter was reconstituted in the renal pelvis.  Complications: None  Impression:Successful placement of a right percutaneous nephrostomy tube with ultrasound and fluoroscopic guidance.   Original Report Authenticated By: Richarda Overlie, M.D.     Anti-infectives: Anti-infectives     Start     Dose/Rate Route Frequency Ordered Stop   09/24/11 1200   ciprofloxacin (CIPRO) IVPB 400 mg     Comments: Hang ON CALL to xray today      400 mg 200 mL/hr over 60 Minutes Intravenous On call 09/24/11 1015 09/24/11 1523          Assessment/Plan: s/p Rt PCN placed 8/23 Doing well Plan per Uro/TRH will follow  Francisco Dunn A 09/25/2011

## 2011-09-26 ENCOUNTER — Telehealth: Payer: Self-pay | Admitting: Family Medicine

## 2011-09-26 ENCOUNTER — Encounter: Payer: Self-pay | Admitting: Family Medicine

## 2011-09-26 NOTE — Telephone Encounter (Addendum)
I see he was discharged from hospital over weekend with R nephrostomy tube placed.  Can we call pt for update?  Also see if he has scheduled with urology or if he needs appt with [place] and to rpt blood work/kidney function.  If so, plz schedule within 2 wks.  thanks.

## 2011-09-27 ENCOUNTER — Telehealth (HOSPITAL_COMMUNITY): Payer: Self-pay | Admitting: *Deleted

## 2011-09-27 NOTE — Telephone Encounter (Signed)
Noted. Thanks.

## 2011-09-27 NOTE — Telephone Encounter (Signed)
Spoke with patient, he's doing better.  Follow up visit scheduled for 10/04/11 at 12:00.

## 2011-10-05 ENCOUNTER — Ambulatory Visit: Payer: Self-pay | Admitting: Oncology

## 2011-10-05 ENCOUNTER — Ambulatory Visit (INDEPENDENT_AMBULATORY_CARE_PROVIDER_SITE_OTHER): Payer: Managed Care, Other (non HMO) | Admitting: Family Medicine

## 2011-10-05 ENCOUNTER — Encounter: Payer: Self-pay | Admitting: Family Medicine

## 2011-10-05 VITALS — BP 146/84 | HR 96 | Temp 98.0°F | Wt 219.0 lb

## 2011-10-05 DIAGNOSIS — G629 Polyneuropathy, unspecified: Secondary | ICD-10-CM

## 2011-10-05 DIAGNOSIS — R209 Unspecified disturbances of skin sensation: Secondary | ICD-10-CM | POA: Insufficient documentation

## 2011-10-05 DIAGNOSIS — C61 Malignant neoplasm of prostate: Secondary | ICD-10-CM

## 2011-10-05 DIAGNOSIS — N289 Disorder of kidney and ureter, unspecified: Secondary | ICD-10-CM

## 2011-10-05 DIAGNOSIS — N1330 Unspecified hydronephrosis: Secondary | ICD-10-CM

## 2011-10-05 DIAGNOSIS — G609 Hereditary and idiopathic neuropathy, unspecified: Secondary | ICD-10-CM

## 2011-10-05 DIAGNOSIS — N179 Acute kidney failure, unspecified: Secondary | ICD-10-CM

## 2011-10-05 LAB — RENAL FUNCTION PANEL
BUN: 24 mg/dL — ABNORMAL HIGH (ref 6–23)
CO2: 27 mEq/L (ref 19–32)
Calcium: 9.5 mg/dL (ref 8.4–10.5)
Creatinine, Ser: 1.6 mg/dL — ABNORMAL HIGH (ref 0.4–1.5)
Glucose, Bld: 96 mg/dL (ref 70–99)
Sodium: 139 mEq/L (ref 135–145)

## 2011-10-05 NOTE — Progress Notes (Signed)
Subjective:    Patient ID: Francisco Dunn, male    DOB: Dec 29, 1950, 61 y.o.   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  Still unclear where hydronephrosis came from, ?Extrinsic compression.  Has urology f/u planned for later today.  May discuss stent placement.  Overall feeling well.  No abd pain.  Voiding well.  No dysuria, urgency, frequency.  No back pain currently.  No fevers/chills.  He had blood work drawn 09/23/2011 which showed acute renal failure with Cr 3.5.  Asked to go to ER for evaluation.  Discharge Cr 2.82.  Will draw stat today hopeful to return by urology appt.  Post-D/C phone call: 09/27/2011.  Records reviewed: Admit date: 09/23/2011  Discharge date: 09/25/2011   Ct Abdomen Pelvis Wo Contrast  09/24/2011 *RADIOLOGY REPORT* Clinical Data: New right hydronephrosis and abnormal BUN and creatinine in a patient with history of prostate cancer.  IMPRESSION: Lobulated mass in the pelvis and rectum and bladder suggesting prostate cancer with multiple lymph node metastases throughout the pelvis and retroperitoneum. Sclerotic bone metastases. The right renal hydronephrosis and ureterectasis with pararenal and periureteral stranding suggesting obstruction and / or infection. No obstructing stone is demonstrated. Changes may represent extrinsic compression. Original Report Authenticated By: Marlon Pel, M.D.   US Renal  09/23/2011 *RADIOLOGY REPORT* Clinical Data: Elevated creatinine. Prostate cancer and hypertension  IMPRESSION: Moderate right hydronephrosis. No obstruction of the left kidney. Original Report Authenticated By: Camelia Phenes, M.D.   Ir Perc Nephrostomy Right  09/24/2011 *RADIOLOGY REPORT* Clinical history:61 year old with prostate cancer and right hydronephrosis. The patient has acute renal failure.  PROCEDURE(S): ULTRASOUND AND FLUOROSCOPIC GUIDED RIGHT PERCUTANEOUS NEPHROSTOMY TUBE PLACEMENT Physician: Rachelle Hora. Lowella Dandy, MD Medications:Ciprofloxacin 400 mg. Versed 1.5 mg, Fentanyl 75 mcg.  Findings:Moderate right hydronephrosis. The catheter was placed through a lower pole calix. Catheter was reconstituted in the renal pelvis. Complications: None Impression:Successful placement of a right percutaneous nephrostomy tube with ultrasound and fluoroscopic guidance. Original Report Authenticated By: Richarda Overlie, M.D.   Had blood work done at cancer center today - PSA.  Has had 2 month h/o peripheral neuropathy bilateral feet.  This started prior to starting casodex.  H/o diet controlled DM.  Past Medical History  Diagnosis Date  . Hand numbness   . Carpal tunnel syndrome     right  . Diabetes type 2, controlled     diet controlled  . Mixed hyperlipidemia   . HTN (hypertension)   . Prostate cancer     Dr. Altamese [place] - advanced prostate cancer, Gleason 5+4, on lupron per Dr. Renae Fickle (onc), added casodex by Olivia Mackie    Past Surgical History  Procedure Date  . Tonsillectomy 1960's  . Wisdom tooth extraction 1968  . Carpal tunnel release 10/12/06    Right  . Hospitalization 09/2011    ARF with R hydro s/p perc nephrostomy, advanced prostate carcinoma with mets to lymph and spine    Review of Systems Per HPI    Objective:   Physical Exam  Nursing note and vitals reviewed. Constitutional: He appears well-developed and well-nourished. No distress.  HENT:  Head: Normocephalic and atraumatic.  Mouth/Throat: Oropharynx is clear and moist. No oropharyngeal exudate.  Eyes: Conjunctivae and EOM are normal. Pupils are equal, round, and reactive to light. No scleral icterus.  Neck: Normal range of motion. Neck  supple.  Cardiovascular: Normal rate, regular rhythm, normal heart sounds and intact distal pulses.   No murmur heard. Pulmonary/Chest: Effort normal and breath sounds normal. No  respiratory distress. He has no wheezes. He has no rales.  Abdominal: Soft. Bowel sounds are normal. He exhibits no distension.       R perc nephrostomy in place, dressings c/d/i  Skin: Skin is warm and dry. No rash noted.  Psychiatric: He has a normal mood and affect.       Assessment & Plan:

## 2011-10-05 NOTE — Assessment & Plan Note (Signed)
See above. Concern for ARF related to prostate cancer.

## 2011-10-05 NOTE — Assessment & Plan Note (Signed)
Check stat Cr today.  Likely due to combination of ACEI as well as right hydronephrosis.  Unclear cause of this, but currently with percutaneous nephrostomy in place on right.  Tolerating well.  Has f/u planned with urology for this afternoon.

## 2011-10-05 NOTE — Assessment & Plan Note (Signed)
Now concern for metastases to spine (focal sclerosis T10, L4) and new liver lesion as well as known lymph spread. Discussed spine findings with patient. Now on casodex as well as continued lupron.  ?hormone resistant disease. rec f/u with onc [facility].

## 2011-10-05 NOTE — Patient Instructions (Addendum)
If numbness in feet getting worse, let me know for medicine. Blood work today (stat renal panel) to check on kidneys as well as numbness.

## 2011-10-05 NOTE — Assessment & Plan Note (Signed)
Check TSH as well as B12/folate today. Discussed use of gabapentin, pt will notify me if becoming bothersome.

## 2011-10-06 LAB — VITAMIN B12: Vitamin B-12: 526 pg/mL (ref 211–911)

## 2011-10-11 ENCOUNTER — Other Ambulatory Visit: Payer: Self-pay | Admitting: Urology

## 2011-10-11 ENCOUNTER — Telehealth: Payer: Self-pay

## 2011-10-11 DIAGNOSIS — N1330 Unspecified hydronephrosis: Secondary | ICD-10-CM

## 2011-10-11 NOTE — Telephone Encounter (Signed)
Pt request recent labs for creatinine level. Patient notified as instructed by result note by telephone .

## 2011-10-12 ENCOUNTER — Encounter (HOSPITAL_COMMUNITY): Payer: Self-pay | Admitting: Pharmacy Technician

## 2011-10-14 ENCOUNTER — Other Ambulatory Visit: Payer: Self-pay | Admitting: Radiology

## 2011-10-19 ENCOUNTER — Other Ambulatory Visit: Payer: Self-pay | Admitting: Urology

## 2011-10-19 ENCOUNTER — Ambulatory Visit (HOSPITAL_COMMUNITY)
Admission: RE | Admit: 2011-10-19 | Discharge: 2011-10-19 | Disposition: A | Discharge status: Home / Self Care | Payer: Managed Care, Other (non HMO) | Source: Ambulatory Visit | Attending: Urology | Admitting: Urology

## 2011-10-19 VITALS — BP 120/76 | HR 64 | Temp 98.3°F | Resp 15

## 2011-10-19 DIAGNOSIS — N1330 Unspecified hydronephrosis: Secondary | ICD-10-CM

## 2011-10-19 DIAGNOSIS — Z436 Encounter for attention to other artificial openings of urinary tract: Secondary | ICD-10-CM | POA: Insufficient documentation

## 2011-10-19 DIAGNOSIS — C61 Malignant neoplasm of prostate: Secondary | ICD-10-CM | POA: Insufficient documentation

## 2011-10-19 DIAGNOSIS — I10 Essential (primary) hypertension: Secondary | ICD-10-CM | POA: Insufficient documentation

## 2011-10-19 DIAGNOSIS — Z79899 Other long term (current) drug therapy: Secondary | ICD-10-CM | POA: Insufficient documentation

## 2011-10-19 DIAGNOSIS — E785 Hyperlipidemia, unspecified: Secondary | ICD-10-CM | POA: Insufficient documentation

## 2011-10-19 DIAGNOSIS — E119 Type 2 diabetes mellitus without complications: Secondary | ICD-10-CM | POA: Insufficient documentation

## 2011-10-19 LAB — BASIC METABOLIC PANEL
BUN: 26 mg/dL — ABNORMAL HIGH (ref 6–23)
CO2: 23 mEq/L (ref 19–32)
Chloride: 102 mEq/L (ref 96–112)
Creatinine, Ser: 1.32 mg/dL (ref 0.50–1.35)
Potassium: 4.5 mEq/L (ref 3.5–5.1)

## 2011-10-19 LAB — CBC
HCT: 41.8 % (ref 39.0–52.0)
Hemoglobin: 14.8 g/dL (ref 13.0–17.0)
MCHC: 35.4 g/dL (ref 30.0–36.0)
MCV: 85.1 fL (ref 78.0–100.0)
RDW: 12.7 % (ref 11.5–15.5)
WBC: 7.7 10*3/uL (ref 4.0–10.5)

## 2011-10-19 MED ORDER — MIDAZOLAM HCL 5 MG/5ML IJ SOLN
INTRAMUSCULAR | Status: AC | PRN
  Administered 2011-10-19: 4 mg via INTRAVENOUS

## 2011-10-19 MED ORDER — IOHEXOL 300 MG/ML  SOLN: 10.0000 mL | Freq: Once | INTRAMUSCULAR | Status: DC | PRN

## 2011-10-19 MED ORDER — SODIUM CHLORIDE 0.9 % IV SOLN: INTRAVENOUS | Status: DC

## 2011-10-19 MED ORDER — CIPROFLOXACIN IN D5W 400 MG/200ML IV SOLN
400.0000 mg | INTRAVENOUS | Status: AC
  Administered 2011-10-19: 400 mg via INTRAVENOUS
  Filled 2011-10-19: qty 200

## 2011-10-19 MED ORDER — FENTANYL CITRATE 0.05 MG/ML IJ SOLN
INTRAMUSCULAR | Status: AC | PRN
  Administered 2011-10-19: 100 ug via INTRAVENOUS

## 2011-10-19 MED ORDER — IOHEXOL 300 MG/ML  SOLN
10.0000 mL | Freq: Once | INTRAMUSCULAR | Status: AC | PRN
  Administered 2011-10-19: 10 mL

## 2011-10-19 NOTE — H&P (Signed)
Francisco Dunn is an 61 y.o. male.   Chief Complaint: "I'm here for a stent in my kidney" HPI: Patient with history of prostate cancer, acute renal failure and right hydronephrosis, s/p right percutaneous nephrostomy, presents today for right ureteral stent placement.  Past Medical History  Diagnosis Date  . Hand numbness   . Carpal tunnel syndrome     right  . Diabetes type 2, controlled     diet controlled  . Mixed hyperlipidemia   . HTN (hypertension)   . Prostate cancer 2012    Dr. Altamese [place] - advanced prostate cancer, Gleason 5+4, on lupron per Dr. Renae Fickle (onc), added casodex by Olivia Mackie    Past Surgical History  Procedure Date  . Tonsillectomy 1960's  . Wisdom tooth extraction 1968  . Carpal tunnel release 10/12/06    Right  . Hospitalization 09/2011    ARF with R hydro s/p perc nephrostomy, advanced prostate carcinoma with mets to lymph and spine    Family History  Problem Relation Age of Onset  . Coronary artery disease Mother     PTCA  . Healthy Father   . Thyroid disease Brother   . Heart attack Maternal Grandfather   . Heart attack Paternal Grandfather   . Heart attack Paternal Grandmother   . Heart attack Paternal Aunt   . Diabetes      Both sides  . Prostate cancer Neg Hx   . Colon cancer Neg Hx   . Breast cancer Neg Hx    Social History:  reports that he has been smoking Cigarettes.  He has a 10 pack-year smoking history. He has never used smokeless tobacco. He reports that he does not drink alcohol or use illicit drugs.  Allergies:  Allergies  Allergen Reactions  . Contrast Media (Iodinated Diagnostic Agents) Nausea And Vomiting    Felt unwell 2 hours post dye with nausea and general feeling unwell  . Lisinopril Other (See Comments)    creatinine elevation 1.4->2.0    Current outpatient prescriptions:amLODipine (NORVASC) 10 MG tablet, Take 10 mg by mouth daily with breakfast., Disp: , Rfl: ;  bicalutamide (CASODEX) 50 MG tablet, Take 50 mg by  mouth daily with breakfast., Disp: , Rfl: ;  GARLIC PO, Take 1 capsule by mouth daily. , Disp: , Rfl: ;  leuprolide (LUPRON) 22.5 MG injection, Inject 22.5 mg into the muscle every 3 (three) months., Disp: , Rfl:  Multiple Vitamin (MULTIVITAMIN) tablet, Take 1 tablet by mouth daily.  , Disp: , Rfl: ;  Pyridoxine HCl (VITAMIN B-6 PO), Take 1 tablet by mouth daily.  , Disp: , Rfl: ;  vitamin C (ASCORBIC ACID) 500 MG tablet, Take 500 mg by mouth daily., Disp: , Rfl:  Current facility-administered medications:0.9 %  sodium chloride infusion, , Intravenous, Continuous, D Kevin Adalida Garver, PA;  ciprofloxacin (CIPRO) IVPB 400 mg, 400 mg, Intravenous, On Call, D Jeananne Rama, PA   Results for orders placed during the hospital encounter of 10/19/11 (from the past 48 hour(s))  APTT     Status: Normal   Collection Time   10/19/11  8:00 AM      Component Value Range Comment   aPTT 29  24 - 37 seconds   CBC     Status: Normal   Collection Time   10/19/11  8:00 AM      Component Value Range Comment   WBC 7.7  4.0 - 10.5 K/uL    RBC 4.91  4.22 - 5.81 MIL/uL  Hemoglobin 14.8  13.0 - 17.0 g/dL    HCT 16.1  09.6 - 04.5 %    MCV 85.1  78.0 - 100.0 fL    MCH 30.1  26.0 - 34.0 pg    MCHC 35.4  30.0 - 36.0 g/dL    RDW 40.9  81.1 - 91.4 %    Platelets 242  150 - 400 K/uL   PROTIME-INR     Status: Normal   Collection Time   10/19/11  8:00 AM      Component Value Range Comment   Prothrombin Time 12.3  11.6 - 15.2 seconds    INR 0.90  0.00 - 1.49    No results found.  Review of Systems  Constitutional: Negative for fever and chills.  Respiratory: Negative for cough and shortness of breath.   Cardiovascular: Negative for chest pain.  Gastrointestinal: Negative for nausea, vomiting and abdominal pain.  Genitourinary: Negative for hematuria and flank pain.  Neurological: Negative for headaches.  Endo/Heme/Allergies: Does not bruise/bleed easily.    Blood pressure 133/92, pulse 66, temperature 98.3 F (36.8  C), resp. rate 18, SpO2 98.00%. Physical Exam  Constitutional: He is oriented to person, place, and time. He appears well-developed and well-nourished.  Cardiovascular: Normal rate and regular rhythm.   Respiratory: Effort normal and breath sounds normal.  GI: Soft. Bowel sounds are normal.       Rt nephrostomy tube intact, draining yellow urine  Musculoskeletal: Normal range of motion. He exhibits no edema.  Neurological: He is alert and oriented to person, place, and time.     Assessment/Plan Patient with hx prostate cancer , renal insufficiency and right hydronephrosis (s/p right percutaneous nephrostomy 09/24/11); plan is for placement of right ureteral stent today. Details/risks of procedure d/w pt/wife with their understanding and consent.  Francisco Dunn,D KEVIN 10/19/2011, 8:47 AM

## 2011-10-19 NOTE — Procedures (Signed)
Convert R Perc neph to 24cm JJ ureteral stent No complication No blood loss. See complete dictation in [facility].

## 2011-10-25 ENCOUNTER — Telehealth: Payer: Self-pay

## 2011-10-25 MED ORDER — GABAPENTIN 300 MG PO CAPS: 300.0000 mg | ORAL_CAPSULE | Freq: Every day | ORAL | Status: DC

## 2011-10-25 NOTE — Telephone Encounter (Signed)
Patient notified as instructed by telephone. 

## 2011-10-25 NOTE — Telephone Encounter (Signed)
Pt still having numbness in feet; Dr Reece Agar had mentioned possible rx could be called in for pt. Pt request med sent to [facility] [address]Please advise.

## 2011-10-25 NOTE — Telephone Encounter (Signed)
Left message on answering machine to call back.

## 2011-10-25 NOTE — Telephone Encounter (Signed)
plz notify gabapentin sent in - may try trial of 300mg  at night time - caution for sedation.  update with how this is working.  Lab Results  Component Value Date   HGBA1C 6.4* 09/23/2011   Lab Results  Component Value Date   CREATININE 1.32 10/19/2011

## 2011-10-26 NOTE — Telephone Encounter (Signed)
Patient notified as instructed by telephone that med should be ready at 4 pm..

## 2011-10-26 NOTE — Telephone Encounter (Signed)
Spoke with Cala Bradford at [address]; [facility] having some electronic submission issues. Gabapentin given verbally as instructed.left v/m for pt to call back.

## 2011-10-26 NOTE — Telephone Encounter (Signed)
The pt called the [facility] and they state they do not have the medication.  He is hoping to pick up today. Call back - [phone]  Thanks!

## 2011-11-02 ENCOUNTER — Ambulatory Visit: Payer: Self-pay | Admitting: Oncology

## 2011-11-09 LAB — COMPREHENSIVE METABOLIC PANEL
Anion Gap: 9 (ref 7–16)
Bilirubin,Total: 0.3 mg/dL (ref 0.2–1.0)
Chloride: 104 mmol/L (ref 98–107)
Co2: 28 mmol/L (ref 21–32)
Creatinine: 1.48 mg/dL — ABNORMAL HIGH (ref 0.60–1.30)
EGFR (African American): 58 — ABNORMAL LOW
EGFR (Non-African Amer.): 50 — ABNORMAL LOW
Glucose: 156 mg/dL — ABNORMAL HIGH (ref 65–99)
Osmolality: 288 (ref 275–301)
Potassium: 4.3 mmol/L (ref 3.5–5.1)
Sodium: 141 mmol/L (ref 136–145)

## 2011-11-09 LAB — CBC CANCER CENTER
Basophil #: 0.1 x10 3/mm (ref 0.0–0.1)
Basophil %: 0.8 %
Eosinophil #: 0.3 x10 3/mm (ref 0.0–0.7)
Lymphocyte %: 17.3 %
MCHC: 33.3 g/dL (ref 32.0–36.0)
MCV: 89 fL (ref 80–100)
Monocyte #: 1 x10 3/mm (ref 0.2–1.0)
Neutrophil #: 6.5 x10 3/mm (ref 1.4–6.5)
Neutrophil %: 68.2 %
RBC: 4.6 10*6/uL (ref 4.40–5.90)
RDW: 13.7 % (ref 11.5–14.5)
WBC: 9.5 x10 3/mm (ref 3.8–10.6)

## 2011-11-10 LAB — PSA: PSA: 14.6 ng/mL — ABNORMAL HIGH (ref 0.0–4.0)

## 2011-11-16 ENCOUNTER — Telehealth: Payer: Self-pay

## 2011-11-16 MED ORDER — PREGABALIN 50 MG PO CAPS: 50.0000 mg | ORAL_CAPSULE | Freq: Three times a day (TID) | ORAL | Status: DC

## 2011-11-16 NOTE — Telephone Encounter (Signed)
Pt said gabapentin is not effective;tightness in feet is no better. Gabapentin makes pt lightheaded. Please advise. [facility] [address]

## 2011-11-16 NOTE — Telephone Encounter (Signed)
Let's try lyrica - plz phone in. plz route PA for me if needed. plz notify pt

## 2011-11-17 NOTE — Telephone Encounter (Signed)
Rx called in as directed and message left notifying patient. 

## 2011-11-23 LAB — CBC CANCER CENTER
Basophil %: 0.9 %
Eosinophil #: 0.1 x10 3/mm (ref 0.0–0.7)
HCT: 42.3 % (ref 40.0–52.0)
Lymphocyte #: 1.2 x10 3/mm (ref 1.0–3.6)
MCH: 29.8 pg (ref 26.0–34.0)
MCHC: 33.1 g/dL (ref 32.0–36.0)
MCV: 90 fL (ref 80–100)
Monocyte #: 0.6 x10 3/mm (ref 0.2–1.0)
Neutrophil #: 6.6 x10 3/mm — ABNORMAL HIGH (ref 1.4–6.5)
Platelet: 231 x10 3/mm (ref 150–440)
RDW: 14.2 % (ref 11.5–14.5)
WBC: 8.5 x10 3/mm (ref 3.8–10.6)

## 2011-11-23 LAB — COMPREHENSIVE METABOLIC PANEL
Albumin: 3.5 g/dL (ref 3.4–5.0)
Alkaline Phosphatase: 95 U/L (ref 50–136)
Anion Gap: 8 (ref 7–16)
BUN: 23 mg/dL — ABNORMAL HIGH (ref 7–18)
Bilirubin,Total: 0.3 mg/dL (ref 0.2–1.0)
Calcium, Total: 9.1 mg/dL (ref 8.5–10.1)
Chloride: 104 mmol/L (ref 98–107)
Creatinine: 1.48 mg/dL — ABNORMAL HIGH (ref 0.60–1.30)
EGFR (African American): 58 — ABNORMAL LOW
Osmolality: 287 (ref 275–301)
Potassium: 4.4 mmol/L (ref 3.5–5.1)
SGPT (ALT): 25 U/L (ref 12–78)
Total Protein: 7.2 g/dL (ref 6.4–8.2)

## 2011-12-03 ENCOUNTER — Ambulatory Visit: Payer: Self-pay | Admitting: Oncology

## 2011-12-14 LAB — COMPREHENSIVE METABOLIC PANEL
Alkaline Phosphatase: 107 U/L (ref 50–136)
BUN: 36 mg/dL — ABNORMAL HIGH (ref 7–18)
Bilirubin,Total: 0.3 mg/dL (ref 0.2–1.0)
Calcium, Total: 9.4 mg/dL (ref 8.5–10.1)
Co2: 28 mmol/L (ref 21–32)
Creatinine: 1.89 mg/dL — ABNORMAL HIGH (ref 0.60–1.30)
EGFR (Non-African Amer.): 37 — ABNORMAL LOW
Glucose: 110 mg/dL — ABNORMAL HIGH (ref 65–99)
Potassium: 4.2 mmol/L (ref 3.5–5.1)
SGOT(AST): 14 U/L — ABNORMAL LOW (ref 15–37)
SGPT (ALT): 24 U/L (ref 12–78)
Sodium: 144 mmol/L (ref 136–145)
Total Protein: 7.6 g/dL (ref 6.4–8.2)

## 2011-12-14 LAB — CBC CANCER CENTER
Basophil %: 1.2 %
Eosinophil %: 2.2 %
HGB: 14.1 g/dL (ref 13.0–18.0)
Lymphocyte #: 1.3 x10 3/mm (ref 1.0–3.6)
MCH: 29.9 pg (ref 26.0–34.0)
MCHC: 32.9 g/dL (ref 32.0–36.0)
MCV: 91 fL (ref 80–100)
Monocyte #: 0.8 x10 3/mm (ref 0.2–1.0)
Monocyte %: 11.3 %
Neutrophil #: 5.1 x10 3/mm (ref 1.4–6.5)
RBC: 4.73 10*6/uL (ref 4.40–5.90)

## 2011-12-14 LAB — HEPATIC FUNCTION PANEL A (ARMC): Bilirubin, Direct: 0.1 mg/dL (ref 0.00–0.20)

## 2011-12-15 LAB — PSA: PSA: 21.1 ng/mL — ABNORMAL HIGH (ref 0.0–4.0)

## 2012-01-02 ENCOUNTER — Ambulatory Visit: Payer: Self-pay | Admitting: Oncology

## 2012-01-12 ENCOUNTER — Encounter: Payer: Self-pay | Admitting: Family Medicine

## 2012-01-31 ENCOUNTER — Other Ambulatory Visit: Payer: Self-pay | Admitting: Urology

## 2012-02-07 ENCOUNTER — Ambulatory Visit: Payer: Self-pay | Admitting: Oncology

## 2012-02-07 ENCOUNTER — Encounter (HOSPITAL_BASED_OUTPATIENT_CLINIC_OR_DEPARTMENT_OTHER): Payer: Self-pay | Admitting: *Deleted

## 2012-02-07 LAB — CBC CANCER CENTER
Basophil #: 0.1 x10 3/mm (ref 0.0–0.1)
Basophil %: 0.9 %
Eosinophil %: 2 %
HCT: 43.9 % (ref 40.0–52.0)
Lymphocyte #: 1.5 x10 3/mm (ref 1.0–3.6)
MCH: 30.7 pg (ref 26.0–34.0)
MCV: 89 fL (ref 80–100)
Monocyte #: 0.8 x10 3/mm (ref 0.2–1.0)
Monocyte %: 9.6 %
Neutrophil %: 68.5 %
Platelet: 220 x10 3/mm (ref 150–440)
RDW: 13.7 % (ref 11.5–14.5)
WBC: 8.1 x10 3/mm (ref 3.8–10.6)

## 2012-02-07 NOTE — Progress Notes (Signed)
To [facility] at 0615,Istat,ekg on arrival-Npo after mn-will take prednisone and amlodipine w/sip water that am-refrain from smoking after mn also.

## 2012-02-08 LAB — PSA: PSA: 17.5 ng/mL — ABNORMAL HIGH (ref 0.0–4.0)

## 2012-02-10 NOTE — H&P (Signed)
Francisco Dunn is a 62 year old male with metastatic prostate cancer.   History of Present Illness This is a 62 year old patient seen in follow up for evaluation of prostate cancer.  Date of diagnosis: 11/12  Type of treatment:  Androgen Deprivation, Leuprolide acetate  Pretreatment PSA: 59.66  Gleason score: 5+4=9              He initially underwent a CT scan and bone scan which he reports were negative. He was started on [facility] agonist therapy indicating he likely had locally advanced disease. A CT scan done on 09/24/11 revealed diffuse retroperitoneal and pelvic adenopathy with focal sclerosis at T10 and L4 consistent with bony metastatic disease as well as a lobulated mass in the pelvis consistent with local extension. He has been under the care of an oncologist in [place], Dr. Koleen Nimrod, who had maintained him on Lupron. His PSA nadar in 7/13 was 5.1. In 8/13 it had increased to 7.3. I started Casodex during his hospitalization. He has since been placed on Zytiga and prednisone.  Right hydronephrosis: He was seen in the hospital in 8/13 do to an elevated creatinine of 3.5. He had some mild renal insufficiency with a baseline creatinine of approximately 1.4. He was found to have right hydronephrosis with some mild left renal atrophy and therefore had a percutaneous nephrostomy tube placed which was eventually internalized to a double-J stent. This resulted in normalization of his creatinine back down to 1.32 in 9/13.   Interval history: He was recently seen in urgent care where his urine was noted to have dipstick positive blood, leukocyte esterase and nitrite although the patient was not having any voiding symptoms. He does have a stent in which certainly could account for many of the findings.     Past Medical History Problems  1. History of  Diabetes Mellitus 250.00 2. History of  Hypertension 401.9  Surgical History Problems  1. History of  Wrist Surgery Right  Current Meds 1. AmLODIPine  Besylate 10 MG Oral Tablet; Therapy: 01Feb2013 to 2. Bicalutamide 50 MG Oral Tablet; Therapy: 24Aug2013 to 3. Casodex 50 MG Oral Tablet; Therapy: (Recorded:03Sep2013) to  Allergies Medication  1. No Known Drug Allergies  Family History Problems  1. Family history of  Acute Myocardial Infarction V17.3 2. Family history of  Breast Cancer V16.3 3. Family history of  Cancer 4. Family history of  Prostate Cancer V16.42  Social History Problems  1. Caffeine Use 2. Current Smoker 305.1 1/2ppd x 10 3. Marital History - Currently Married Denied  4. History of  Alcohol Use  Review of Systems: A comprehensive review of systems was negative except as noted in HPI. Physical Exam: General: Alert, awake, oriented x3, in no acute distress.   HEENT: [place]/AT PEERL, EOMI OROPHARYNX:  Moist, No exudate/ erythema/lesions.   Heart: Regular rate and rhythm, without murmurs, rubs, gallops.   Lungs: Clear to auscultation, no wheezing or rhonchi noted.    Abdomen: Soft, nontender, nondistended, positive bowel sounds, no masses no hepatosplenomegaly noted.   Neuro: No focal neurological deficits noted cranial nerves II through XII grossly intact. Musculoskeletal: No warm swelling or erythema around joints, no spinal tenderness noted. Psychiatric: Patient alert and oriented x3, good insight and cognition, good recent to remote recall.  Vitals Vital Signs  BMI Calculated: 29.66 BSA Calculated: 2.22 Height: 6 ft  Weight: 219 lb  Blood Pressure: 154 / 93 Temperature: 97.9 F Heart Rate: 78  The following images/tracing/specimen were independently visualized:  Renal  ultrasound: The right kidney measured 11.7 cm with 13 mm of parenchyma. The left kidney measured 10.7 cm with 9 mm of parenchyma. Both kidneys have normal cortical renal sinus echo patterns. Cysts were noted within both kidneys. The stent was identified within the right ureter and renal pelvis region and extending down in the bladder. There  was no evidence of hydronephrosis on either side.    Assessment Assessed  1. Adenocarcinoma Of The Prostate Gland 185 2. 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 Ureteral Obstruction Right 593.4 5. Hydronephrosis On The Right 591 6. H/O Acute Cystitis 595.0   He has no evidence of hydronephrosis on the right or left side. We discussed the need for stent change and evaluation of any encrustation or luminal obstruction of the stent. If it turns out none of these are present then, because he had no hydronephrosis on his ultrasound, we could potentially prolong the interval between stent changes.  As far as his metastatic prostate cancer goes it appears he has developed castrate resistant disease and he has begun abiaterone + prednisone. He has an excellent performance status at this time and is completely asymptomatic.   Plan  1. His urine was cultured in preparation for surgery. It was positive and he was placed on appropriate antibiotics. 2. He is going to be scheduled for cystoscopy with retrograde pyelogram and stent change.

## 2012-02-11 ENCOUNTER — Encounter (HOSPITAL_BASED_OUTPATIENT_CLINIC_OR_DEPARTMENT_OTHER): Payer: Self-pay | Admitting: *Deleted

## 2012-02-11 ENCOUNTER — Encounter (HOSPITAL_BASED_OUTPATIENT_CLINIC_OR_DEPARTMENT_OTHER): Payer: Self-pay | Admitting: Anesthesiology

## 2012-02-11 ENCOUNTER — Ambulatory Visit (HOSPITAL_BASED_OUTPATIENT_CLINIC_OR_DEPARTMENT_OTHER): Payer: Managed Care, Other (non HMO) | Admitting: Anesthesiology

## 2012-02-11 ENCOUNTER — Encounter (HOSPITAL_BASED_OUTPATIENT_CLINIC_OR_DEPARTMENT_OTHER)
Admission: RE | Disposition: A | Discharge status: Home / Self Care | Payer: Self-pay | Source: Ambulatory Visit | Attending: Urology

## 2012-02-11 ENCOUNTER — Ambulatory Visit (HOSPITAL_BASED_OUTPATIENT_CLINIC_OR_DEPARTMENT_OTHER)
Admission: RE | Admit: 2012-02-11 | Discharge: 2012-02-11 | Disposition: A | Discharge status: Home / Self Care | Payer: Managed Care, Other (non HMO) | Source: Ambulatory Visit | Attending: Urology | Admitting: Urology

## 2012-02-11 DIAGNOSIS — C7952 Secondary malignant neoplasm of bone marrow: Secondary | ICD-10-CM | POA: Insufficient documentation

## 2012-02-11 DIAGNOSIS — C61 Malignant neoplasm of prostate: Secondary | ICD-10-CM | POA: Insufficient documentation

## 2012-02-11 DIAGNOSIS — Z466 Encounter for fitting and adjustment of urinary device: Secondary | ICD-10-CM | POA: Insufficient documentation

## 2012-02-11 DIAGNOSIS — F17200 Nicotine dependence, unspecified, uncomplicated: Secondary | ICD-10-CM | POA: Insufficient documentation

## 2012-02-11 DIAGNOSIS — E119 Type 2 diabetes mellitus without complications: Secondary | ICD-10-CM | POA: Insufficient documentation

## 2012-02-11 DIAGNOSIS — N135 Crossing vessel and stricture of ureter without hydronephrosis: Secondary | ICD-10-CM | POA: Diagnosis present

## 2012-02-11 DIAGNOSIS — C7951 Secondary malignant neoplasm of bone: Secondary | ICD-10-CM | POA: Insufficient documentation

## 2012-02-11 DIAGNOSIS — I10 Essential (primary) hypertension: Secondary | ICD-10-CM | POA: Insufficient documentation

## 2012-02-11 DIAGNOSIS — C779 Secondary and unspecified malignant neoplasm of lymph node, unspecified: Secondary | ICD-10-CM | POA: Insufficient documentation

## 2012-02-11 HISTORY — PX: CYSTOSCOPY W/ URETERAL STENT PLACEMENT: SHX1429

## 2012-02-11 HISTORY — PX: CYSTOSCOPY W/ URETERAL STENT REMOVAL: SHX1430

## 2012-02-11 LAB — POCT I-STAT 4, (NA,K, GLUC, HGB,HCT)
Glucose, Bld: 148 mg/dL — ABNORMAL HIGH (ref 70–99)
HCT: 41 % (ref 39.0–52.0)
Hemoglobin: 13.9 g/dL (ref 13.0–17.0)
Potassium: 4.5 mEq/L (ref 3.5–5.1)
Sodium: 142 mEq/L (ref 135–145)

## 2012-02-11 LAB — GLUCOSE, CAPILLARY: Glucose-Capillary: 147 mg/dL — ABNORMAL HIGH (ref 70–99)

## 2012-02-11 SURGERY — CYSTOSCOPY, WITH RETROGRADE PYELOGRAM AND URETERAL STENT INSERTION
Anesthesia: General | Site: Ureter | Laterality: Right | Wound class: Clean Contaminated

## 2012-02-11 MED ORDER — ACETAMINOPHEN 10 MG/ML IV SOLN
1000.0000 mg | Freq: Once | INTRAVENOUS | Status: DC | PRN
  Filled 2012-02-11: qty 100

## 2012-02-11 MED ORDER — FENTANYL CITRATE 0.05 MG/ML IJ SOLN
INTRAMUSCULAR | Status: DC | PRN
  Administered 2012-02-11: 50 ug via INTRAVENOUS

## 2012-02-11 MED ORDER — OXYCODONE HCL 5 MG PO TABS
5.0000 mg | ORAL_TABLET | Freq: Once | ORAL | Status: DC | PRN
  Filled 2012-02-11: qty 1

## 2012-02-11 MED ORDER — PHENAZOPYRIDINE HCL 200 MG PO TABS
200.0000 mg | ORAL_TABLET | Freq: Once | ORAL | Status: AC
  Administered 2012-02-11: 200 mg via ORAL
  Filled 2012-02-11: qty 1

## 2012-02-11 MED ORDER — MEPERIDINE HCL 25 MG/ML IJ SOLN
6.2500 mg | INTRAMUSCULAR | Status: DC | PRN
  Filled 2012-02-11: qty 1

## 2012-02-11 MED ORDER — STERILE WATER FOR IRRIGATION IR SOLN
Status: DC | PRN
  Administered 2012-02-11: 3000 mL

## 2012-02-11 MED ORDER — ONDANSETRON HCL 4 MG/2ML IJ SOLN
INTRAMUSCULAR | Status: DC | PRN
  Administered 2012-02-11: 4 mg via INTRAVENOUS

## 2012-02-11 MED ORDER — LACTATED RINGERS IV SOLN
INTRAVENOUS | Status: DC
  Administered 2012-02-11: 07:00:00 via INTRAVENOUS
  Filled 2012-02-11: qty 1000

## 2012-02-11 MED ORDER — CIPROFLOXACIN IN D5W 200 MG/100ML IV SOLN
200.0000 mg | INTRAVENOUS | Status: DC
  Filled 2012-02-11: qty 100

## 2012-02-11 MED ORDER — ACETAMINOPHEN 10 MG/ML IV SOLN
INTRAVENOUS | Status: DC | PRN
  Administered 2012-02-11: 1000 mg via INTRAVENOUS

## 2012-02-11 MED ORDER — LEVOFLOXACIN IN D5W 500 MG/100ML IV SOLN
500.0000 mg | Freq: Once | INTRAVENOUS | Status: AC
  Administered 2012-02-11: 500 mg via INTRAVENOUS
  Filled 2012-02-11 (×2): qty 100

## 2012-02-11 MED ORDER — PROPOFOL 10 MG/ML IV BOLUS
INTRAVENOUS | Status: DC | PRN
  Administered 2012-02-11: 200 mg via INTRAVENOUS

## 2012-02-11 MED ORDER — OXYCODONE HCL 5 MG/5ML PO SOLN
5.0000 mg | Freq: Once | ORAL | Status: DC | PRN
  Filled 2012-02-11: qty 5

## 2012-02-11 MED ORDER — HYDROCODONE-ACETAMINOPHEN 10-325 MG PO TABS: 1.0000 | ORAL_TABLET | ORAL | Status: DC | PRN

## 2012-02-11 MED ORDER — PROMETHAZINE HCL 25 MG/ML IJ SOLN
6.2500 mg | INTRAMUSCULAR | Status: DC | PRN
  Filled 2012-02-11: qty 1

## 2012-02-11 MED ORDER — LIDOCAINE HCL (CARDIAC) 20 MG/ML IV SOLN
INTRAVENOUS | Status: DC | PRN
  Administered 2012-02-11: 80 mg via INTRAVENOUS

## 2012-02-11 MED ORDER — IOHEXOL 350 MG/ML SOLN
INTRAVENOUS | Status: DC | PRN
  Administered 2012-02-11: 5 mL

## 2012-02-11 MED ORDER — HYDROMORPHONE HCL PF 1 MG/ML IJ SOLN
0.2500 mg | INTRAMUSCULAR | Status: DC | PRN
  Filled 2012-02-11: qty 1

## 2012-02-11 MED ORDER — LIDOCAINE HCL 2 % EX GEL
CUTANEOUS | Status: DC | PRN
  Administered 2012-02-11: 1 via URETHRAL

## 2012-02-11 MED ORDER — PHENAZOPYRIDINE HCL 200 MG PO TABS: 200.0000 mg | ORAL_TABLET | Freq: Three times a day (TID) | ORAL | Status: DC | PRN

## 2012-02-11 SURGICAL SUPPLY — 24 items
ADAPTER CATH URET PLST 4-6FR (CATHETERS) ×2 IMPLANT
ADPR CATH URET STRL DISP 4-6FR (CATHETERS) ×1
BAG DRAIN URO-CYSTO SKYTR STRL (DRAIN) ×2 IMPLANT
BAG DRN UROCATH (DRAIN) ×1
CANISTER SUCT LVC 12 LTR MEDI- (MISCELLANEOUS) ×2 IMPLANT
CATH INTERMIT  6FR 70CM (CATHETERS) ×2 IMPLANT
CATH URET 5FR 28IN CONE TIP (BALLOONS)
CATH URET 5FR 70CM CONE TIP (BALLOONS) IMPLANT
CLOTH BEACON ORANGE TIMEOUT ST (SAFETY) ×2 IMPLANT
DRAPE CAMERA CLOSED 9X96 (DRAPES) ×2 IMPLANT
GLOVE BIO SURGEON STRL SZ8 (GLOVE) ×2 IMPLANT
GLOVE SKINSENSE NS SZ7.5 (GLOVE) ×1
GLOVE SKINSENSE STRL SZ7.5 (GLOVE) ×1 IMPLANT
GOWN PREVENTION PLUS LG XLONG (DISPOSABLE) ×2 IMPLANT
GOWN STRL REIN XL XLG (GOWN DISPOSABLE) ×2 IMPLANT
GUIDEWIRE 0.038 PTFE COATED (WIRE) ×2 IMPLANT
GUIDEWIRE ANG ZIPWIRE 038X150 (WIRE) IMPLANT
GUIDEWIRE STR DUAL SENSOR (WIRE) ×2 IMPLANT
IV NS IRRIG 3000ML ARTHROMATIC (IV SOLUTION) IMPLANT
NS IRRIG 500ML POUR BTL (IV SOLUTION) IMPLANT
PACK CYSTOSCOPY (CUSTOM PROCEDURE TRAY) ×2 IMPLANT
STENT CONTOUR 7FRX24 (STENTS) ×2 IMPLANT
STENT CONTOUR 7FRX24X.038 (STENTS) IMPLANT
WATER STERILE IRR 3000ML UROMA (IV SOLUTION) ×2 IMPLANT

## 2012-02-11 NOTE — Transfer of Care (Signed)
Immediate Anesthesia Transfer of Care Note  Patient: Francisco Dunn  Procedure(s) Performed: Procedure(s) (LRB) with comments: CYSTOSCOPY WITH RETROGRADE PYELOGRAM/URETERAL STENT PLACEMENT (Right) - NO URETEROSCOPY CYSTO RIGHT RETROGRADE PYELOGRAM AND STENT CHANGE CYSTOSCOPY WITH STENT REMOVAL (Right)  Patient Location: PACU  Anesthesia Type:General  Level of Consciousness: awake, alert  and oriented  Airway & Oxygen Therapy: Patient Spontanous Breathing and Patient connected to nasal cannula oxygen  Post-op Assessment: Report given to PACU RN  Post vital signs: Reviewed and stable  Complications: No apparent anesthesia complications

## 2012-02-11 NOTE — Anesthesia Preprocedure Evaluation (Addendum)
Anesthesia Evaluation  Patient identified by MRN, date of birth, ID band Patient awake    Reviewed: Allergy & Precautions, H&P , NPO status , Patient's Chart, lab work & pertinent test results  Airway Mallampati: II TM Distance: >3 FB Neck ROM: Full    Dental  (+) Dental Advisory Given and Teeth Intact   Pulmonary Current Smoker,  breath sounds clear to auscultation  Pulmonary exam normal       Cardiovascular hypertension, Pt. on medications - Past MI Rhythm:Regular Rate:Normal     Neuro/Psych  Neuromuscular disease negative psych ROS   GI/Hepatic negative GI ROS, Neg liver ROS,   Endo/Other  diabetes, Type 2  Renal/GU Renal InsufficiencyRenal disease     Musculoskeletal negative musculoskeletal ROS (+)   Abdominal   Peds  Hematology negative hematology ROS (+)   Anesthesia Other Findings   Reproductive/Obstetrics                         Anesthesia Physical Anesthesia Plan  ASA: II  Anesthesia Plan: General   Post-op Pain Management:    Induction: Intravenous  Airway Management Planned: LMA  Additional Equipment:   Intra-op Plan:   Post-operative Plan: Extubation in OR  Informed Consent: I have reviewed the patients History and Physical, chart, labs and discussed the procedure including the risks, benefits and alternatives for the proposed anesthesia with the patient or authorized representative who has indicated his/her understanding and acceptance.   Dental advisory given  Plan Discussed with: CRNA  Anesthesia Plan Comments:         Anesthesia Quick Evaluation

## 2012-02-11 NOTE — Anesthesia Postprocedure Evaluation (Signed)
Anesthesia Post Note  Patient: Francisco Dunn  Procedure(s) Performed: Procedure(s) (LRB): CYSTOSCOPY WITH RETROGRADE PYELOGRAM/URETERAL STENT PLACEMENT (Right) CYSTOSCOPY WITH STENT REMOVAL (Right)  Anesthesia type: General  Patient location: PACU  Post pain: Pain level controlled  Post assessment: Post-op Vital signs reviewed  Last Vitals: BP 146/79  Pulse 60  Temp 36.7 C (Oral)  Resp 16  Ht 6' (1.829 m)  Wt 226 lb (102.513 kg)  BMI 30.65 kg/m2  SpO2 100%  Post vital signs: Reviewed  Level of consciousness: sedated  Complications: No apparent anesthesia complications

## 2012-02-11 NOTE — Op Note (Signed)
PATIENT:  Francisco Dunn  Preoperative diagnosis:  1. Right ureteral obstruction  Postoperative diagnosis:  1. Same   Procedure:  1. Cystoscopy 2. Removal of right ureteral stent 3. Right retrograde pyelography with interpretation  4. Replacement of right ureteral stent (7 [place], 24 cm)  Surgeon: Veverly Fells. Vernie Ammons, M.D.  Anesthesia: General  Complications: None  EBL: Minimal  Specimens: None  Indication: [name] is a 62 year old patient with right ureteral obstruction secondary to metastatic prostate cancer. He previously had a right double-J stent placed after undergoing a percutaneous nephrostomy. He's done well and tolerated the stent. It been in about 3 months. It's time for its removal and replacement. The procedure was discussed with the patient in detail. He elects to proceed.  Description of procedure:  The patient was taken to the operating room and general anesthesia was induced.  The patient was placed in the dorsal lithotomy position, prepped and draped in the usual sterile fashion, and preoperative antibiotics were administered. A preoperative time-out was performed.   Cystourethroscopy was performed.  The patient's urethra was examined and was normal/ demonstrated bilobar prostatic hypertrophy/ bilobar prostatic hypertrophy with a median lobe that was slightly enlarged as well. The bladder was then systematically examined in its entirety. There was no evidence for any bladder tumors, stones, or other mucosal pathology.  His right stent was noted to be exiting his right ureteral orifice.  I grasped the stent with alligator graspers and withdrew it through the urethral meatus. I then passed a 0.038 inch floppy-tipped guidewire through the stent and into the area the renal pelvis removing the stent I left the guidewire in place. Over the guidewire I then passed a 6 [place] open-ended ureteral catheter up to the level of the renal pelvis and removed the guidewire. A retrograde  pyelogram was then performed.  Retrograde pyelography was performed by injecting full-strength contrast through the open-ended catheter under direct fluoroscopy. It revealed a dilated renal pelvis and blunting of the calyces. I then backed the open-ended catheter down the ureter while injecting contrast simultaneously and noted a fairly narrowed stricturing of the ureter 1-2 cm below the UPJ and some mild dilatation of the mid ureter with some further narrowing but less so, in the distal ureter. The guidewire was then advanced through the open-ended catheter back into the area of the renal pelvis and the open-ended catheter was removed. The  The double-J stent was passed over the guidewire into the area the renal pelvis and as the guidewire was removed good curl was noted in the area of the pelvis and good curl of the distal aspect of the stent was noted in the bladder. The bladder was then emptied and the procedure ended. I instilled 2% lidocaine jelly in the urethra and applied a penile clamp. The patient appeared to tolerate the procedure well and without complications.  The patient was able to be awakened and transferred to the recovery unit in satisfactory condition.

## 2012-02-11 NOTE — Anesthesia Procedure Notes (Addendum)
Procedure Name: LMA Insertion Date/Time: 02/11/2012 7:32 AM Performed by: Maris Berger T Pre-anesthesia Checklist: Patient identified, Emergency Drugs available, Suction available and Patient being monitored Patient Re-evaluated:Patient Re-evaluated prior to inductionOxygen Delivery Method: Circle System Utilized Preoxygenation: Pre-oxygenation with 100% oxygen Intubation Type: IV induction Ventilation: Mask ventilation without difficulty LMA: LMA inserted LMA Size: 5.0 Number of attempts: 1 Airway Equipment and Method: bite block Placement Confirmation: positive ETCO2 Dental Injury: Teeth and Oropharynx as per pre-operative assessment

## 2012-02-13 ENCOUNTER — Encounter: Payer: Self-pay | Admitting: Family Medicine

## 2012-02-14 ENCOUNTER — Encounter (HOSPITAL_BASED_OUTPATIENT_CLINIC_OR_DEPARTMENT_OTHER): Payer: Self-pay | Admitting: Urology

## 2012-02-29 ENCOUNTER — Encounter: Payer: Self-pay | Admitting: Family Medicine

## 2012-02-29 ENCOUNTER — Ambulatory Visit (INDEPENDENT_AMBULATORY_CARE_PROVIDER_SITE_OTHER): Payer: Managed Care, Other (non HMO) | Admitting: Family Medicine

## 2012-02-29 VITALS — BP 166/90 | HR 76 | Temp 97.5°F | Wt 239.0 lb

## 2012-02-29 DIAGNOSIS — R0789 Other chest pain: Secondary | ICD-10-CM | POA: Insufficient documentation

## 2012-02-29 DIAGNOSIS — F17200 Nicotine dependence, unspecified, uncomplicated: Secondary | ICD-10-CM

## 2012-02-29 DIAGNOSIS — E119 Type 2 diabetes mellitus without complications: Secondary | ICD-10-CM

## 2012-02-29 DIAGNOSIS — Z720 Tobacco use: Secondary | ICD-10-CM

## 2012-02-29 DIAGNOSIS — E782 Mixed hyperlipidemia: Secondary | ICD-10-CM

## 2012-02-29 DIAGNOSIS — I10 Essential (primary) hypertension: Secondary | ICD-10-CM

## 2012-02-29 DIAGNOSIS — C61 Malignant neoplasm of prostate: Secondary | ICD-10-CM

## 2012-02-29 MED ORDER — HYDROCHLOROTHIAZIDE 12.5 MG PO CAPS: 12.5000 mg | ORAL_CAPSULE | Freq: Every day | ORAL | Status: DC

## 2012-02-29 NOTE — Assessment & Plan Note (Signed)
Check FLP next blood work. 

## 2012-02-29 NOTE — Progress Notes (Signed)
Subjective:    Patient ID: Francisco Dunn, male    DOB: 1950-10-24, 62 y.o.   MRN: [id number]  HPI CC: 4 mo f/u  Pleasant 62 yo with ho/ metastatic prostate cancer, initially followed by Dr. Garner Nash urology but now sees Dr. Vernie Ammons ([facility]) and Dr. Renae Fickle (onc) who recently had casodex added by urology and most recently was started on prednisone and zytiga (abiaterone) who recently had cystoscopy with stent replacement and retrograde pyelogram.  Pt states that at this time mets were also found.  Stent was replaced.  Has come to realization that prostate cancer is here to stay.  HTN - No HA, vision changes, SOB, leg swelling.  Compliant with amlodipine.  Intolerant to ACEI - due to ARF.  States ranges 120-150/80-90s. DM - diet controlled - doesn't check at home. On lyrica for periph neuropathy. Smoking - 1/3 ppd. Occasional stiffness in elbows and substernal pressure/tightness in chest.  This seems to be happening about 2x/mo.  Last about 30 min.  Improves with heating pad and aspirin.  No back pain, no SOB, nausea, coughing, fevers.  Exertion doesn't affect this.  Happens at rest.  Very strong fmhx CAD/MI.  Sedentary lifestyle. Lab Results  Component Value Date   HGBA1C 6.4* 09/23/2011   Lab Results  Component Value Date   CHOL 224* 12/15/2010   HDL 34.30* 12/15/2010   LDLCALC 119* 10/08/2008   LDLDIRECT 147.5 12/15/2010   TRIG 221.0* 12/15/2010   CHOLHDL 7 12/15/2010    States had kidneys checked at oncologist - with blood work.  Past Medical History  Diagnosis Date  . Hand numbness   . Diabetes type 2, controlled     diet controlled  . Mixed hyperlipidemia   . HTN (hypertension)   . Prostate cancer metastatic to multiple sites 2012    Dr. Altamese [place] - advanced prostate cancer, Gleason 5+4, on lupron per Dr. Renae Fickle (onc), added casodex by [facility]  . Carpal tunnel syndrome     right-release    Past Surgical History  Procedure Date  . Tonsillectomy 1960's  . Wisdom  tooth extraction 1968  . Carpal tunnel release 10/12/06    Right  . Hospitalization 09/2011    ARF with R hydro s/p perc nephrostomy, advanced prostate carcinoma with mets to lymph and spine  . Cystoscopy with stent placement 8/13    right, removal 02/2012 ([place])  . Cystoscopy w/ ureteral stent placement 02/11/2012    Procedure: CYSTOSCOPY WITH RETROGRADE PYELOGRAM/URETERAL STENT PLACEMENT;  Surgeon: Garnett Farm, MD;  [facility];  Service: Urology;  Laterality: Right;  NO URETEROSCOPY CYSTO RIGHT RETROGRADE PYELOGRAM AND STENT CHANGE  . Cystoscopy w/ ureteral stent removal 02/11/2012    Procedure: CYSTOSCOPY WITH STENT REMOVAL;  Surgeon: Garnett Farm, MD;  Location: [facility];  Service: Urology;  Laterality: Right;    Family History  Problem Relation Age of Onset  . Coronary artery disease Mother     PTCA  . Healthy Father   . Thyroid disease Brother   . Heart attack Maternal Grandfather   . Heart attack Paternal Grandfather   . Heart attack Paternal Grandmother   . Heart attack Paternal Aunt   . Diabetes      Both sides  . Prostate cancer Neg Hx   . Colon cancer Neg Hx   . Breast cancer Neg Hx    Review of Systems Per HPI    Objective:   Physical Exam  Nursing note and  vitals reviewed. Constitutional: He appears well-developed and well-nourished. No distress.  HENT:  Head: Normocephalic and atraumatic.  Mouth/Throat: Oropharynx is clear and moist. No oropharyngeal exudate.  Eyes: Conjunctivae normal and EOM are normal. Pupils are equal, round, and reactive to light. No scleral icterus.  Neck: Normal range of motion. Neck supple. Carotid bruit is not present. No thyromegaly present.  Cardiovascular: Normal rate, regular rhythm, normal heart sounds and intact distal pulses.   No murmur heard. Pulmonary/Chest: Effort normal and breath sounds normal. No respiratory distress. He has no wheezes. He has no rales.  Lymphadenopathy:     He has no cervical adenopathy.  Skin: Skin is warm and dry. No rash noted.       Assessment & Plan:

## 2012-02-29 NOTE — Assessment & Plan Note (Signed)
Chronic, diet controlled. Noted weight gain. check A1c next blood work. Lab Results  Component Value Date   HGBA1C 6.4* 09/23/2011

## 2012-02-29 NOTE — Assessment & Plan Note (Signed)
Chronic, deteriorated. Start HCTZ 12.5mg  daily. rtc 1 wk for BMP. Pt agrees with plan, aware to start in addition to amlodipine.

## 2012-02-29 NOTE — Assessment & Plan Note (Addendum)
Continue to follow with [facility] and onc for metastatic prostate cancer. On prednisone as well as zytiga - checking A1c.

## 2012-02-29 NOTE — Patient Instructions (Addendum)
Start aspirin 81mg  daily - enteric coated as easier on stomach. Pass by [facility]'s office for referral to heart doctor to get heart evaluated. Good to see you today. Call us with questions. Return in 1 week after starting hydrochlorothiazide at 12.5mg  daily for blood work. You need to quit smoking!

## 2012-02-29 NOTE — Assessment & Plan Note (Signed)
Continue to encourage cessation. 

## 2012-02-29 NOTE — Assessment & Plan Note (Signed)
Some concerning features - reviewed EKG from 02/15/2012 - ?inferior q waves. Will refer to cards for further risk stratification.

## 2012-03-01 LAB — CBC CANCER CENTER
Eosinophil %: 4.5 %
HGB: 14.1 g/dL (ref 13.0–18.0)
Lymphocyte #: 1.7 x10 3/mm (ref 1.0–3.6)
Lymphocyte %: 22.2 %
MCHC: 34.7 g/dL (ref 32.0–36.0)
MCV: 88 fL (ref 80–100)
Monocyte #: 0.9 x10 3/mm (ref 0.2–1.0)
RBC: 4.59 10*6/uL (ref 4.40–5.90)
RDW: 13.6 % (ref 11.5–14.5)
WBC: 7.8 x10 3/mm (ref 3.8–10.6)

## 2012-03-01 LAB — BASIC METABOLIC PANEL
BUN: 24 mg/dL — ABNORMAL HIGH (ref 7–18)
Calcium, Total: 8.7 mg/dL (ref 8.5–10.1)
Chloride: 101 mmol/L (ref 98–107)
Co2: 29 mmol/L (ref 21–32)
Creatinine: 1.44 mg/dL — ABNORMAL HIGH (ref 0.60–1.30)
EGFR (Non-African Amer.): 52 — ABNORMAL LOW
Glucose: 152 mg/dL — ABNORMAL HIGH (ref 65–99)
Osmolality: 283 (ref 275–301)
Potassium: 3.9 mmol/L (ref 3.5–5.1)
Sodium: 138 mmol/L (ref 136–145)

## 2012-03-04 ENCOUNTER — Ambulatory Visit: Payer: Self-pay | Admitting: Oncology

## 2012-03-04 DIAGNOSIS — I2510 Atherosclerotic heart disease of native coronary artery without angina pectoris: Secondary | ICD-10-CM

## 2012-03-04 HISTORY — DX: Atherosclerotic heart disease of native coronary artery without angina pectoris: I25.10

## 2012-03-07 ENCOUNTER — Other Ambulatory Visit (INDEPENDENT_AMBULATORY_CARE_PROVIDER_SITE_OTHER): Payer: Managed Care, Other (non HMO)

## 2012-03-07 DIAGNOSIS — E782 Mixed hyperlipidemia: Secondary | ICD-10-CM

## 2012-03-07 DIAGNOSIS — I10 Essential (primary) hypertension: Secondary | ICD-10-CM

## 2012-03-07 DIAGNOSIS — E119 Type 2 diabetes mellitus without complications: Secondary | ICD-10-CM

## 2012-03-07 LAB — HEMOGLOBIN A1C: Hgb A1c MFr Bld: 7.6 % — ABNORMAL HIGH (ref 4.6–6.5)

## 2012-03-07 LAB — BASIC METABOLIC PANEL
CO2: 24 mEq/L (ref 19–32)
Chloride: 99 mEq/L (ref 96–112)
Sodium: 135 mEq/L (ref 135–145)

## 2012-03-07 LAB — LIPID PANEL
HDL: 33.4 mg/dL — ABNORMAL LOW (ref >39.00)
Total CHOL/HDL Ratio: 7
Triglycerides: 180 mg/dL — ABNORMAL HIGH (ref 0.0–149.0)

## 2012-03-09 ENCOUNTER — Encounter: Payer: Self-pay | Admitting: Cardiovascular Disease

## 2012-03-09 ENCOUNTER — Ambulatory Visit (INDEPENDENT_AMBULATORY_CARE_PROVIDER_SITE_OTHER): Payer: Managed Care, Other (non HMO) | Admitting: Cardiovascular Disease

## 2012-03-09 VITALS — BP 114/76 | HR 90 | Ht 72.0 in | Wt 219.8 lb

## 2012-03-09 DIAGNOSIS — I10 Essential (primary) hypertension: Secondary | ICD-10-CM

## 2012-03-09 DIAGNOSIS — F17200 Nicotine dependence, unspecified, uncomplicated: Secondary | ICD-10-CM

## 2012-03-09 DIAGNOSIS — E782 Mixed hyperlipidemia: Secondary | ICD-10-CM

## 2012-03-09 DIAGNOSIS — R0789 Other chest pain: Secondary | ICD-10-CM

## 2012-03-09 DIAGNOSIS — N179 Acute kidney failure, unspecified: Secondary | ICD-10-CM

## 2012-03-09 DIAGNOSIS — E119 Type 2 diabetes mellitus without complications: Secondary | ICD-10-CM

## 2012-03-09 MED ORDER — ATORVASTATIN CALCIUM 20 MG PO TABS: 20.0000 mg | ORAL_TABLET | Freq: Every day | ORAL | Status: DC

## 2012-03-09 NOTE — Assessment & Plan Note (Signed)
Blood pressure is well controlled on today's visit. No changes made to the medications. 

## 2012-03-09 NOTE — Addendum Note (Signed)
Addended by: Sabino Snipes E on: 03/09/2012 03:44 PM   Modules accepted: Orders

## 2012-03-09 NOTE — Assessment & Plan Note (Signed)
We have encouraged him to continue to work on weaning his cigarettes and smoking cessation. He will continue to work on this and does not want any assistance with chantix.  

## 2012-03-09 NOTE — Progress Notes (Signed)
Patient ID: Francisco Dunn, male    DOB: 1950-12-24, 62 y.o.   MRN: [id number]  HPI Comments: Francisco Dunn is a very pleasant 62 year old gentleman with a history of prostate cancer, metastatic, patient of Dr. Sharen Hones, who presents by referral for recent symptoms of chest pressure radiating to his arms, abnormal EKG.  He reports that over the past 2 months, he has had several episodes of chest pressure that radiated to his elbows bilaterally. Symptoms last for obese 30 minutes. He does not do any regular exercise at baseline though is relatively active. Uncertain if chest pain symptoms occurred while being active. He does not have chest pain on a regular basis, just these several episodes. he denies any gastrointestinal issues. He does continue to smoke and has been smoking for many decades.   He reports having elevated creatinine in the setting of obstructed ureter requiring a stent in the on lisinopril. Denies any side effects from statins in the past.   Reports a strong history of coronary artery disease. Mother had angioplasty in her 60s, grandfather died of heart attack.  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 abiraterone Acetate (ZYTIGA) 250 MG tablet Take 1,000 mg by mouth daily. Take on an empty stomach 1 hour before or 2 hours after a meal      . amLODipine (NORVASC) 10 MG tablet Take 10 mg by mouth daily with breakfast.      . aspirin EC 81 MG tablet Take 81 mg by mouth daily.      Marland Kitchen GARLIC PO Take 1 capsule by mouth daily.       . hydrochlorothiazide (MICROZIDE) 12.5 MG capsule Take 1 capsule (12.5 mg total) by mouth daily.  30 capsule  6  . HYDROcodone-acetaminophen (NORCO) 10-325 MG per tablet Take 1-2 tablets by mouth every 4 (four) hours as needed for pain.  30 tablet  0  . Multiple Vitamin (MULTIVITAMIN) tablet  Take 1 tablet by mouth daily.        . phenazopyridine (PYRIDIUM) 200 MG tablet Take 1 tablet (200 mg total) by mouth 3 (three) times daily as needed for pain.  30 tablet  0  . predniSONE (STERAPRED UNI-PAK) 5 MG TABS Take 5 mg by mouth 2 (two) times daily.      . pregabalin (LYRICA) 50 MG capsule Take 1 capsule (50 mg total) by mouth 3 (three) times daily.  90 capsule  3  . Pyridoxine HCl (VITAMIN B-6 PO) Take 1 tablet by mouth daily.        . vitamin C (ASCORBIC ACID) 500 MG tablet Take 500 mg by mouth daily.         Review of Systems  Constitutional: Negative.   HENT: Negative.   Eyes: Negative.   Respiratory: Negative.   Cardiovascular: Positive for chest pain.       Radiating to his arms bilaterally  Gastrointestinal: Negative.   Musculoskeletal: Negative.   Skin: Negative.   Neurological: Negative.   Hematological: Negative.   Psychiatric/Behavioral: Negative.   All other systems reviewed and are negative.    BP 114/76  Pulse 90  Ht 6' (1.829 m)  Wt 219 lb 12 oz (99.678 kg)  BMI 29.80 kg/m2  Physical Exam  Nursing note and vitals reviewed. Constitutional: He is oriented to person, place, and time. He  appears well-developed and well-nourished.  HENT:  Head: Normocephalic.  Nose: Nose normal.  Mouth/Throat: Oropharynx is clear and moist.  Eyes: Conjunctivae normal are normal. Pupils are equal, round, and reactive to light.  Neck: Normal range of motion. Neck supple. No JVD present.  Cardiovascular: Normal rate, regular rhythm, S1 normal, S2 normal, normal heart sounds and intact distal pulses.  Exam reveals no gallop and no friction rub.   No murmur heard. Pulmonary/Chest: Effort normal and breath sounds normal. No respiratory distress. He has no wheezes. He has no rales. He exhibits no tenderness.  Abdominal: Soft. Bowel sounds are normal. He exhibits no distension. There is no tenderness.  Musculoskeletal: Normal range of motion. He exhibits no edema and no  tenderness.  Lymphadenopathy:    He has no cervical adenopathy.  Neurological: He is alert and oriented to person, place, and time. Coordination normal.  Skin: Skin is warm and dry. No rash noted. No erythema.  Psychiatric: He has a normal mood and affect. His behavior is normal. Judgment and thought content normal.           Assessment and Plan

## 2012-03-09 NOTE — Assessment & Plan Note (Addendum)
Concerned about his episodes of chest pressure radiating to his arms bilaterally. He has several risk factors including high cholesterol, long history of smoking. EKG concerning for inferior MI. He is unable to treadmill secondary to significant hip pain. We have ordered a pharmacologic Myoview for next week to rule out old MI and rule out ischemia. We have suggested he start aspirin 81 mg daily and Lipitor 20 mg daily.

## 2012-03-09 NOTE — Assessment & Plan Note (Signed)
We have recommended he start Lipitor 20 mg daily. If he has any side effects, we could try an alternate statin.

## 2012-03-09 NOTE — Assessment & Plan Note (Signed)
Prior renal failure in the setting of obstructed ureter, also on an ACE inhibitor at that time.

## 2012-03-09 NOTE — Patient Instructions (Addendum)
We will schedule a stress test at [facility] Hold the amlodipine the night before and morning of the procedure No caffeine 24 hours before the test  Start aspirin 81 mg daily Start atorvastatin one a day  Please call us if you have new issues that need to be addressed before your next appt.   [facility] MYOVIEW  Your caregiver has ordered a Stress Test with nuclear imaging. The purpose of this test is to evaluate the blood supply to your heart muscle. This procedure is referred to as a "Non-Invasive Stress Test." This is because other than having an IV started in your vein, nothing is inserted or "invades" your body. Cardiac stress tests are done to find areas of poor blood flow to the heart by determining the extent of coronary artery disease (CAD). Some patients exercise on a treadmill, which naturally increases the blood flow to your heart, while others who are  unable to walk on a treadmill due to physical limitations have a pharmacologic/chemical stress agent called Lexiscan . This medicine will mimic walking on a treadmill by temporarily increasing your coronary blood flow.   Please note: these test may take anywhere between 2-4 hours to complete  PLEASE REPORT TO [facility] MEDICAL MALL ENTRANCE  THE VOLUNTEERS AT THE FIRST DESK WILL DIRECT YOU WHERE TO GO  Date of Procedure:_February 14, 2014 __  Arrival Time for Procedure:___8:45 a.m.   Instructions regarding medication:    _x___:  Hold other medications as follows:_Do not take the amlodipine the night before and the am of procedure.   PLEASE NOTIFY THE OFFICE AT LEAST 24 HOURS IN ADVANCE IF YOU ARE UNABLE TO KEEP YOUR APPOINTMENT.  [phone]  How to prepare for your Myoview test:  1. Do not eat or drink after midnight 2. No caffeine for 24 hours prior to test 3. Your medication may be taken with water.  If your doctor stopped a medication because of this test, do not take that medication. 4. Ladies, please do not wear dresses.  Skirts or  pants are appropriate. Please wear a short sleeve shirt. 5. No perfume, cologne or lotion. 6. Wear comfortable walking shoes. No heels!

## 2012-03-09 NOTE — Assessment & Plan Note (Signed)
We have encouraged continued exercise, careful diet management in an effort to lose weight. 

## 2012-03-10 ENCOUNTER — Other Ambulatory Visit: Payer: Self-pay | Admitting: Family Medicine

## 2012-03-10 DIAGNOSIS — N289 Disorder of kidney and ureter, unspecified: Secondary | ICD-10-CM

## 2012-03-13 ENCOUNTER — Telehealth: Payer: Self-pay

## 2012-03-13 NOTE — Telephone Encounter (Signed)
Patient does not need a precert for test.

## 2012-03-13 NOTE — Telephone Encounter (Signed)
Message copied by Festus Aloe on Mon Mar 13, 2012  9:28 AM ------      Message from: Britt Bolognese      Created: Fri Mar 10, 2012  3:25 PM       Pt has Community education officer.  Per Evie Lacks, [phone], no precert required for pts plan for CPT [phone]      ----- Message -----         From: Festus Aloe, CMA         Sent: 03/09/2012   4:03 PM           To: Donne Hazel Pre Cert / Marylyn Ishihara Myoview       [facility]      March 17, 2012      Chest pain      Dr. Mariah Milling.      [employer]       ------

## 2012-03-15 ENCOUNTER — Other Ambulatory Visit (INDEPENDENT_AMBULATORY_CARE_PROVIDER_SITE_OTHER): Payer: Managed Care, Other (non HMO)

## 2012-03-15 DIAGNOSIS — N289 Disorder of kidney and ureter, unspecified: Secondary | ICD-10-CM

## 2012-03-15 LAB — RENAL FUNCTION PANEL
Albumin: 3.3 g/dL — ABNORMAL LOW (ref 3.5–5.2)
BUN: 34 mg/dL — ABNORMAL HIGH (ref 6–23)
CO2: 27 mEq/L (ref 19–32)
Chloride: 98 mEq/L (ref 96–112)
GFR: 49.3 mL/min — ABNORMAL LOW (ref >60.00)

## 2012-03-16 ENCOUNTER — Ambulatory Visit: Payer: Managed Care, Other (non HMO) | Admitting: Family Medicine

## 2012-03-17 ENCOUNTER — Ambulatory Visit: Payer: Self-pay | Admitting: Cardiovascular Disease

## 2012-03-17 DIAGNOSIS — R079 Chest pain, unspecified: Secondary | ICD-10-CM

## 2012-03-20 ENCOUNTER — Telehealth: Payer: Self-pay

## 2012-03-20 ENCOUNTER — Ambulatory Visit (INDEPENDENT_AMBULATORY_CARE_PROVIDER_SITE_OTHER): Payer: Managed Care, Other (non HMO) | Admitting: Family Medicine

## 2012-03-20 ENCOUNTER — Other Ambulatory Visit: Payer: Self-pay

## 2012-03-20 ENCOUNTER — Encounter: Payer: Self-pay | Admitting: Family Medicine

## 2012-03-20 VITALS — BP 128/78 | HR 84 | Temp 97.9°F | Wt 221.5 lb

## 2012-03-20 DIAGNOSIS — N289 Disorder of kidney and ureter, unspecified: Secondary | ICD-10-CM

## 2012-03-20 DIAGNOSIS — I10 Essential (primary) hypertension: Secondary | ICD-10-CM

## 2012-03-20 DIAGNOSIS — E782 Mixed hyperlipidemia: Secondary | ICD-10-CM

## 2012-03-20 DIAGNOSIS — C61 Malignant neoplasm of prostate: Secondary | ICD-10-CM

## 2012-03-20 DIAGNOSIS — R0789 Other chest pain: Secondary | ICD-10-CM

## 2012-03-20 DIAGNOSIS — F17200 Nicotine dependence, unspecified, uncomplicated: Secondary | ICD-10-CM

## 2012-03-20 DIAGNOSIS — IMO0001 Reserved for inherently not codable concepts without codable children: Secondary | ICD-10-CM

## 2012-03-20 DIAGNOSIS — R079 Chest pain, unspecified: Secondary | ICD-10-CM

## 2012-03-20 MED ORDER — GLIMEPIRIDE 1 MG PO TABS: 1.0000 mg | ORAL_TABLET | Freq: Every day | ORAL | Status: DC

## 2012-03-20 NOTE — Telephone Encounter (Signed)
Per [facility] scheduling, Francisco Dunn, 0800 time slot not available Has 0900 slot available I will talk with Dr. Mariah Milling and call her back

## 2012-03-20 NOTE — Assessment & Plan Note (Signed)
Recent positive stress test - pending catheterization. On ASA 81mg  and lipitor 20mg  daily.

## 2012-03-20 NOTE — Telephone Encounter (Signed)
"  Myoview was positive for reversible ischemia. Schedule patient for LHC. If patient wants to come in to discuss, may schedule for appt" VO Dr. Alvis Lemmings, RN

## 2012-03-20 NOTE — Assessment & Plan Note (Signed)
Continue to f/u with [facility] and onc for metastatic prostate cancer. On prednisone and zytiga.

## 2012-03-20 NOTE — Telephone Encounter (Signed)
Pt informed of the need to have LHC since stress test was positive Says he saw PCP this am and was told by him as well Pt was offered appt with [place] to discuss before scheduling but wants to go ahead and proceed Tentatively scheduled for 2/28 but will talk with Dr. Mariah Milling about time and call him back with details and plan Understanding verb

## 2012-03-20 NOTE — Telephone Encounter (Signed)
lmtcb

## 2012-03-20 NOTE — Assessment & Plan Note (Signed)
Agree with lipitor started by cards.

## 2012-03-20 NOTE — Telephone Encounter (Signed)
Verbal instructions given to pt JY:[facility]GN heart cath scheduled for 03/31/12 Coming for labs at our office 2/26 at 0830; also needs CXR same day

## 2012-03-20 NOTE — Assessment & Plan Note (Signed)
Merits close monitoring in upcoming catheterization.

## 2012-03-20 NOTE — Assessment & Plan Note (Signed)
Increased A1c likely due to prednisone use. Start low dose amaryl. Discussed caution with hypoglycemia. Hopeful for only temporary use of sulfonylurea while on oral steroid.

## 2012-03-20 NOTE — Telephone Encounter (Signed)
Per Dr. Mariah Milling, okay to schedule LHC for 2/28 at 0800 Will schedule and call pt back with instructions

## 2012-03-20 NOTE — Telephone Encounter (Signed)
Per Dr. Mariah Milling, ok to schedule for 0900 2/28 LMTCB on pt's home phone to discuss details

## 2012-03-20 NOTE — Assessment & Plan Note (Signed)
Encouraged continued smoking cessation. Contemplative.

## 2012-03-20 NOTE — Assessment & Plan Note (Signed)
Chronic, better controlled with addition of HCTZ.

## 2012-03-20 NOTE — Progress Notes (Signed)
  Subjective:    Patient ID: Francisco Dunn, male    DOB: 10-Nov-1950, 62 y.o.   MRN: [id number]  HPI CC: f/u from recent blood work  Pleasant 62 yo with ho/ metastatic prostate cancer, initially followed by Dr. Garner Nash urology but now sees Dr. Vernie Ammons ([facility]) and Dr. Renae Fickle (onc) who recently was started on prednisone and zytiga (abiaterone) who recently had cystoscopy with stent replacement and retrograde pyelogram.   Recent concerning chest and left arm pressure pain - referred to cards, positive stress test, to schedule L catheterization.  Discussed positive test - discussed anticipated phone call from cards to schedule this.  HTN - HCTZ was recently started in addition to his amlodipine.  Not currently ACEI candidate 2/2 ARF with prior trial.  Compliant with meds.  CRI - cr recently bumped to 2.1, but now back down to 1.5.  Prior thought due to obstruction.  Will need caution with dye load.  Metastatic prostate cancer - Significant nocturia.  Occasional extreme dry mouth at night.  Recent bone scan 03/13/2012.    DM - steroid induced deterioration.  Doesn't check sugars.   Lab Results  Component Value Date   HGBA1C 7.6* 03/07/2012   Smoking - 1/3 ppd or less.  Contemplative.  Past Medical History  Diagnosis Date  . Hand numbness   . Diabetes type 2, controlled     diet controlled  . Mixed hyperlipidemia   . HTN (hypertension)   . Prostate cancer metastatic to multiple sites 2012    Dr. Altamese  - advanced prostate cancer, Gleason 5+4, on lupron per Dr. Renae Fickle (onc), added casodex by [facility]  . Carpal tunnel syndrome     right-release   Review of Systems Per HPI    Objective:   Physical Exam  Nursing note and vitals reviewed. Constitutional: He appears well-developed and well-nourished. No distress.  HENT:  Head: Normocephalic and atraumatic.  Mouth/Throat: Oropharynx is clear and moist. No oropharyngeal exudate.  Eyes: Conjunctivae and EOM are normal. Pupils are  equal, round, and reactive to light. No scleral icterus.  Neck: Normal range of motion. Neck supple. Carotid bruit is not present.  Cardiovascular: Normal rate, regular rhythm, normal heart sounds and intact distal pulses.   No murmur heard. Pulmonary/Chest: Breath sounds normal. No respiratory distress. He has no wheezes. He has no rales.  Musculoskeletal: He exhibits no edema.  Skin: Skin is warm and dry. No rash noted.       Assessment & Plan:

## 2012-03-20 NOTE — Patient Instructions (Addendum)
Blood pressure is looking good! Let's start amaryl at 1mg  daily at least while you're on prednisone. Expect a call from Dr. Windell Hummingbird office - if you don't hear from them this week, call me.

## 2012-03-28 ENCOUNTER — Other Ambulatory Visit: Payer: Self-pay | Admitting: *Deleted

## 2012-03-28 DIAGNOSIS — E119 Type 2 diabetes mellitus without complications: Secondary | ICD-10-CM

## 2012-03-28 DIAGNOSIS — E782 Mixed hyperlipidemia: Secondary | ICD-10-CM

## 2012-03-28 DIAGNOSIS — I10 Essential (primary) hypertension: Secondary | ICD-10-CM

## 2012-03-28 DIAGNOSIS — N179 Acute kidney failure, unspecified: Secondary | ICD-10-CM

## 2012-03-28 DIAGNOSIS — R0789 Other chest pain: Secondary | ICD-10-CM

## 2012-03-28 DIAGNOSIS — F17200 Nicotine dependence, unspecified, uncomplicated: Secondary | ICD-10-CM

## 2012-03-29 ENCOUNTER — Ambulatory Visit: Payer: Self-pay | Admitting: Cardiovascular Disease

## 2012-03-29 ENCOUNTER — Other Ambulatory Visit (INDEPENDENT_AMBULATORY_CARE_PROVIDER_SITE_OTHER): Payer: Managed Care, Other (non HMO)

## 2012-03-29 ENCOUNTER — Other Ambulatory Visit: Payer: Self-pay | Admitting: *Deleted

## 2012-03-29 DIAGNOSIS — R079 Chest pain, unspecified: Secondary | ICD-10-CM

## 2012-03-29 DIAGNOSIS — R0989 Other specified symptoms and signs involving the circulatory and respiratory systems: Secondary | ICD-10-CM

## 2012-03-30 LAB — CBC WITH DIFFERENTIAL
Basos: 0 % (ref 0–3)
Eos: 4 % (ref 0–5)
Eosinophils Absolute: 0.4 10*3/uL (ref 0.0–0.4)
HCT: 41.9 % (ref 37.5–51.0)
Hemoglobin: 14.4 g/dL (ref 12.6–17.7)
Lymphs: 25 % (ref 14–46)
MCH: 30.5 pg (ref 26.6–33.0)
MCHC: 34.4 g/dL (ref 31.5–35.7)
Neutrophils Absolute: 5.2 10*3/uL (ref 1.4–7.0)

## 2012-03-30 LAB — BASIC METABOLIC PANEL
BUN: 21 mg/dL (ref 8–27)
Creatinine, Ser: 1.34 mg/dL — ABNORMAL HIGH (ref 0.76–1.27)
GFR calc Af Amer: 66 mL/min/{1.73_m2} (ref >59)
GFR calc non Af Amer: 57 mL/min/{1.73_m2} — ABNORMAL LOW (ref >59)
Glucose: 157 mg/dL — ABNORMAL HIGH (ref 65–99)
Potassium: 4 mmol/L (ref 3.5–5.2)

## 2012-03-30 LAB — PROTIME-INR
INR: 1 (ref 0.8–1.2)
Prothrombin Time: 10.9 s (ref 9.1–12.0)

## 2012-03-31 ENCOUNTER — Ambulatory Visit: Payer: Self-pay | Admitting: Cardiovascular Disease

## 2012-03-31 DIAGNOSIS — I2510 Atherosclerotic heart disease of native coronary artery without angina pectoris: Secondary | ICD-10-CM

## 2012-04-01 ENCOUNTER — Ambulatory Visit: Payer: Self-pay | Admitting: Oncology

## 2012-04-05 LAB — PSA: PSA: 22.8 ng/mL — ABNORMAL HIGH (ref 0.0–4.0)

## 2012-04-10 ENCOUNTER — Ambulatory Visit (INDEPENDENT_AMBULATORY_CARE_PROVIDER_SITE_OTHER): Payer: Managed Care, Other (non HMO) | Admitting: Cardiovascular Disease

## 2012-04-10 ENCOUNTER — Encounter: Payer: Self-pay | Admitting: Cardiovascular Disease

## 2012-04-10 VITALS — BP 152/92 | HR 100 | Ht 72.0 in | Wt 224.0 lb

## 2012-04-10 DIAGNOSIS — I2510 Atherosclerotic heart disease of native coronary artery without angina pectoris: Secondary | ICD-10-CM

## 2012-04-10 DIAGNOSIS — E782 Mixed hyperlipidemia: Secondary | ICD-10-CM

## 2012-04-10 DIAGNOSIS — I10 Essential (primary) hypertension: Secondary | ICD-10-CM

## 2012-04-10 MED ORDER — METOPROLOL TARTRATE 25 MG PO TABS: 25.0000 mg | ORAL_TABLET | Freq: Two times a day (BID) | ORAL | Status: DC

## 2012-04-10 NOTE — Assessment & Plan Note (Signed)
Continue treatment with atorvastatin. 

## 2012-04-10 NOTE — Assessment & Plan Note (Signed)
He was recently diagnosed with an occluded mid left circumflex but the territory appears to be small to medium in size and the occlusion seems to be chronic. I recommend continuing medical therapy. We to control his blood pressure and heart rate. Thus, I will add metoprolol 25 mg twice daily. Continue other medications.

## 2012-04-10 NOTE — Progress Notes (Signed)
HPI  [name] is a very pleasant 62 year old gentleman who is here today for followup visit regarding coronary artery disease noted on recent cardiac catheterization. He has a history of metastatic prostate cancer, hypertension and hyperlipidemia. He was seen recently for substernal chest tightness. He was noted to have an abnormal EKG with inferior Q waves. He underwent a nuclear stress test which showed small area of ischemia in the inferior lateral wall with borderline reduced LV systolic function. He underwent cardiac catheterization by Dr. Mariah Milling which showed minor irregularities in the LAD and RCA. The left circumflex was occluded in the midsegment. We were not certain about the duration of occlusion. Thus, I attempted PCI on the left circumflex but was not able to cross the lesion likely due to a chronic state. I was able to see the distal vessel after creating a small channel. The distal vessel was overall small to medium in size. Due to that, medical therapy is recommended. Since the catheterization, he reports no further chest pain.  Reports a strong family history of coronary artery disease. Mother had angioplasty in her 20s, grandfather died of heart attack.   Allergies  Allergen Reactions  . Contrast Media (Iodinated Diagnostic Agents) Nausea And Vomiting    Felt unwell 2 hours post dye with nausea and general feeling unwell  . Lisinopril Other (See Comments)    creatinine elevation 1.4->2.0     Current Outpatient Prescriptions on File Prior to Visit  Medication Sig Dispense Refill  . abiraterone Acetate (ZYTIGA) 250 MG tablet Take 1,000 mg by mouth daily. Take on an empty stomach 1 hour before or 2 hours after a meal      . amLODipine (NORVASC) 10 MG tablet Take 10 mg by mouth daily with breakfast.      . aspirin EC 81 MG tablet Take 81 mg by mouth daily.      Marland Kitchen atorvastatin (LIPITOR) 20 MG tablet Take 1 tablet (20 mg total) by mouth daily.  90 tablet  3  . GARLIC PO Take 1  capsule by mouth daily.       Marland Kitchen glimepiride (AMARYL) 1 MG tablet Take 1 tablet (1 mg total) by mouth daily before breakfast.  30 tablet  3  . hydrochlorothiazide (MICROZIDE) 12.5 MG capsule Take 1 capsule (12.5 mg total) by mouth daily.  30 capsule  6  . Multiple Vitamin (MULTIVITAMIN) tablet Take 1 tablet by mouth daily.        . phenazopyridine (PYRIDIUM) 200 MG tablet Take 1 tablet (200 mg total) by mouth 3 (three) times daily as needed for pain.  30 tablet  0  . predniSONE (STERAPRED UNI-PAK) 5 MG TABS Take 5 mg by mouth 2 (two) times daily.      . Pyridoxine HCl (VITAMIN B-6 PO) Take 1 tablet by mouth daily.        . vitamin C (ASCORBIC ACID) 500 MG tablet Take 500 mg by mouth daily.       No current facility-administered medications on file prior to visit.     Past Medical History  Diagnosis Date  . Hand numbness   . Diabetes type 2, controlled     diet controlled  . Mixed hyperlipidemia   . HTN (hypertension)   . Prostate cancer metastatic to multiple sites 2012    Dr. Altamese  - advanced prostate cancer, Gleason 5+4, on lupron per Dr. Renae Fickle (onc), added casodex by [facility]  . Carpal tunnel syndrome  right-release  . Coronary artery disease 03/2012    Abnormal nuclear stress test with small ischemia in the inferior lateral wall. Cardiac catheterization showed occluded mid left circumflex with minor irregularities in the LAD and RCA. Ejection fraction was 55%. Attempted LCX PCI but was not able to cross the lesion with a wire likely due to her chronic state     Past Surgical History  Procedure Laterality Date  . Tonsillectomy  1960's  . Wisdom tooth extraction  1968  . Carpal tunnel release  10/12/06    Right  . Hospitalization  09/2011    ARF with R hydro s/p perc nephrostomy, advanced prostate carcinoma with mets to lymph and spine  . Cystoscopy with stent placement  8/13    right, removal 02/2012 (Ottelin)  . Cystoscopy w/ ureteral stent placement  02/11/2012      Procedure: CYSTOSCOPY WITH RETROGRADE PYELOGRAM/URETERAL STENT PLACEMENT;  Surgeon: Garnett Farm, MD;  Location: [facility];  Service: Urology;  Laterality: Right;  NO URETEROSCOPY CYSTO RIGHT RETROGRADE PYELOGRAM AND STENT CHANGE  . Cystoscopy w/ ureteral stent removal  02/11/2012    Procedure: CYSTOSCOPY WITH STENT REMOVAL;  Surgeon: Garnett Farm, MD;  Location: [facility];  Service: Urology;  Laterality: Right;  . Cardiac catheterization  2/ 14    [facility]     Family History  Problem Relation Age of Onset  . Coronary artery disease Mother     PTCA  . Healthy Father   . Thyroid disease Brother   . Heart attack Maternal Grandfather   . Heart attack Paternal Grandfather   . Heart attack Paternal Grandmother   . Heart attack Paternal Aunt   . Diabetes      Both sides  . Prostate cancer Neg Hx   . Colon cancer Neg Hx   . Breast cancer Neg Hx      History   Social History  . Marital Status: Married    Spouse Name: N/A    Number of Children: 5  . Years of Education: N/A   Occupational History  . Not on file.   Social History Main Topics  . Smoking status: Current Every Day Smoker -- 0.30 packs/day for 20 years    Types: Cigarettes  . Smokeless tobacco: Never Used  . Alcohol Use: No  . Drug Use: No  . Sexually Active: Not on file   Other Topics Concern  . Not on file   Social History Narrative   Married   5 children--none in home   [place] 1 hour/2 x weekly   Skid wrapping/banding--by machine     PHYSICAL EXAM   BP 152/92  Pulse 100  Ht 6' (1.829 m)  Wt 224 lb (101.606 kg)  BMI 30.37 kg/m2 Constitutional: He is oriented to person, place, and time. He appears well-developed and well-nourished. No distress.  HENT: No nasal discharge.  Head: Normocephalic and atraumatic.  Eyes: Pupils are equal and round. Right eye exhibits no discharge. Left eye exhibits no discharge.  Neck: Normal range of motion. Neck supple. No JVD  present. No thyromegaly present.  Cardiovascular: Normal rate, regular rhythm, normal heart sounds and. Exam reveals no gallop and no friction rub. No murmur heard.  Pulmonary/Chest: Effort normal and breath sounds normal. No stridor. No respiratory distress. He has no wheezes. He has no rales. He exhibits no tenderness.  Abdominal: Soft. Bowel sounds are normal. He exhibits no distension. There is no tenderness. There is no rebound and  no guarding.  Musculoskeletal: Normal range of motion. He exhibits no edema and no tenderness.  Neurological: He is alert and oriented to person, place, and time. Coordination normal.  Skin: Skin is warm and dry. No rash noted. He is not diaphoretic. No erythema. No pallor.  Psychiatric: He has a normal mood and affect. His behavior is normal. Judgment and thought content normal.  Right groin: No hematoma or bruit.     EKG: Sinus  Rhythm  -Inferior infarct -age undetermined  -Prominent R(V1) -true posterior extension of inferior MI  -Left axis -may be secondary to infarct.   ABNORMAL    ASSESSMENT AND PLAN

## 2012-04-10 NOTE — Assessment & Plan Note (Signed)
His blood pressure is elevated. Due to his recent diagnosis of coronary artery disease with mild angina, metoprolol was added as outlined above.

## 2012-04-10 NOTE — Patient Instructions (Addendum)
Start Metoprolol 25 mg twice daily.  Continue other medications.  Follow up in 4 months with Dr. Mariah Milling.

## 2012-05-02 ENCOUNTER — Ambulatory Visit: Payer: Self-pay | Admitting: Oncology

## 2012-05-02 LAB — COMPREHENSIVE METABOLIC PANEL
Anion Gap: 7 (ref 7–16)
Bilirubin,Total: 0.4 mg/dL (ref 0.2–1.0)
Chloride: 101 mmol/L (ref 98–107)
Creatinine: 1.61 mg/dL — ABNORMAL HIGH (ref 0.60–1.30)
EGFR (African American): 53 — ABNORMAL LOW
EGFR (Non-African Amer.): 45 — ABNORMAL LOW
Osmolality: 284 (ref 275–301)
Total Protein: 7.3 g/dL (ref 6.4–8.2)

## 2012-05-02 LAB — CBC CANCER CENTER
Basophil %: 0.8 %
Eosinophil #: 0.4 x10 3/mm (ref 0.0–0.7)
Eosinophil %: 4.2 %
Lymphocyte #: 2.1 x10 3/mm (ref 1.0–3.6)
Lymphocyte %: 22.2 %
MCH: 30.6 pg (ref 26.0–34.0)
MCV: 88 fL (ref 80–100)
Neutrophil %: 64.1 %
RBC: 4.72 10*6/uL (ref 4.40–5.90)
RDW: 14 % (ref 11.5–14.5)

## 2012-05-03 LAB — PSA: PSA: 22.5 ng/mL — ABNORMAL HIGH (ref 0.0–4.0)

## 2012-05-08 ENCOUNTER — Other Ambulatory Visit: Payer: Self-pay | Admitting: Urology

## 2012-05-08 ENCOUNTER — Encounter: Payer: Self-pay | Admitting: Family Medicine

## 2012-06-01 ENCOUNTER — Ambulatory Visit: Payer: Self-pay | Admitting: Oncology

## 2012-06-06 ENCOUNTER — Ambulatory Visit (INDEPENDENT_AMBULATORY_CARE_PROVIDER_SITE_OTHER): Payer: Managed Care, Other (non HMO) | Admitting: Family Medicine

## 2012-06-06 ENCOUNTER — Encounter: Payer: Self-pay | Admitting: Family Medicine

## 2012-06-06 VITALS — BP 130/82 | HR 82 | Temp 97.6°F | Wt 226.0 lb

## 2012-06-06 DIAGNOSIS — N289 Disorder of kidney and ureter, unspecified: Secondary | ICD-10-CM

## 2012-06-06 DIAGNOSIS — C61 Malignant neoplasm of prostate: Secondary | ICD-10-CM

## 2012-06-06 DIAGNOSIS — I10 Essential (primary) hypertension: Secondary | ICD-10-CM

## 2012-06-06 LAB — BASIC METABOLIC PANEL WITH GFR
BUN: 18 mg/dL (ref 6–23)
CO2: 27 meq/L (ref 19–32)
Calcium: 9 mg/dL (ref 8.4–10.5)
Chloride: 102 meq/L (ref 96–112)
Creatinine, Ser: 1.4 mg/dL (ref 0.4–1.5)
GFR: 55.5 mL/min — ABNORMAL LOW
Glucose, Bld: 111 mg/dL — ABNORMAL HIGH (ref 70–99)
Potassium: 3.3 meq/L — ABNORMAL LOW (ref 3.5–5.1)
Sodium: 137 meq/L (ref 135–145)

## 2012-06-06 LAB — HEMOGLOBIN A1C: Hgb A1c MFr Bld: 7.2 % — ABNORMAL HIGH (ref 4.6–6.5)

## 2012-06-06 NOTE — Assessment & Plan Note (Signed)
Chronic, stable. Continue meds. 

## 2012-06-06 NOTE — Progress Notes (Signed)
Subjective:    Patient ID: Francisco Dunn, male    DOB: 03/09/1950, 62 y.o.   MRN: [id number]  HPI CC: 3 mo f/u  DM - uncontrolled as of last check but attributed to prolonged prednisone course.  Does not check sugars - does not have glucose meter.  No hypoglycemic sxs. Lab Results  Component Value Date   HGBA1C 7.6* 03/07/2012     HTN - No HA, vision changes, CP/tightness, SOB, leg swelling.  CAD - seen by Dr. Kirke Corin.  S/p L heart catheterization - found to have chronically occluded left Cx - not amenable to PCI Prostate cancer - zytiga and prednisone stopped as zytiga was no longer working per patient - planning stent change next month.  Planning on starting new prostate med today (exacta?).  Ran out of zytiga and prednisone a few days ago.  Noticing scaling of skin recently - on face.  No increased sun exposure.  Wt Readings from Last 3 Encounters:  06/06/12 226 lb (102.513 kg)  04/10/12 224 lb (101.606 kg)  03/20/12 221 lb 8 oz (100.472 kg)   Past Medical History  Diagnosis Date  . Hand numbness   . Diabetes type 2, controlled     diet controlled  . Mixed hyperlipidemia   . HTN (hypertension)   . Prostate cancer metastatic to multiple sites 2012    advanced prostate cancer, Gleason 5+4, on lupron per Dr. Renae Fickle (onc), added casodex, now on abiaterone+prednisone (Ottelin)  . Carpal tunnel syndrome     right-release  . Coronary artery disease 03/2012    Abnormal nuclear stress test with small ischemia in the inferior lateral wall. Cardiac catheterization showed occluded mid left circumflex with minor irregularities in the LAD and RCA. Ejection fraction was 55%. Attempted LCX PCI but was not able to cross the lesion with a wire likely due to her chronic state  . Hydronephrosis, right     stent replaced Q5 mo    Past Surgical History  Procedure Laterality Date  . Tonsillectomy  1960's  . Wisdom tooth extraction  1968  . Carpal tunnel release  10/12/06    Right  .  Hospitalization  09/2011    ARF with R hydro s/p perc nephrostomy, advanced prostate carcinoma with mets to lymph and spine  . Cystoscopy with stent placement  8/13    right, removal 02/2012 (Ottelin)  . Cystoscopy w/ ureteral stent placement  02/11/2012    Procedure: CYSTOSCOPY WITH RETROGRADE PYELOGRAM/URETERAL STENT PLACEMENT;  Surgeon: Garnett Farm, MD;  Location: [facility];  Service: Urology;  Laterality: Right;  NO URETEROSCOPY CYSTO RIGHT RETROGRADE PYELOGRAM AND STENT CHANGE  . Cystoscopy w/ ureteral stent removal  02/11/2012    Procedure: CYSTOSCOPY WITH STENT REMOVAL;  Surgeon: Garnett Farm, MD;  Location[facility];  Service: Urology;  Laterality: Right;  . Cardiac catheterization  2/ 14    [facility]    Review of Systems Per HPI    Objective:   Physical Exam  Nursing note and vitals reviewed. Constitutional: He appears well-developed and well-nourished. No distress.  HENT:  Head: Normocephalic and atraumatic.  Mouth/Throat: Oropharynx is clear and moist. No oropharyngeal exudate.  Eyes: Conjunctivae and EOM are normal. Pupils are equal, round, and reactive to light. No scleral icterus.  Neck: Normal range of motion. Neck supple.  Cardiovascular: Normal rate, regular rhythm, normal heart sounds and intact distal pulses.   No murmur heard. Pulmonary/Chest: Effort normal and breath sounds normal. No respiratory distress.  He has no wheezes. He has no rales.  Musculoskeletal: He exhibits no edema.  Skin: Skin is warm and dry. Rash noted.  Scaly rash on scalp and scaly patches on right forearm/dorsal hand       Assessment & Plan:

## 2012-06-06 NOTE — Assessment & Plan Note (Signed)
Check A1c today.  Depending on results will decrease amaryl vs sent glucose meter to monitor more closely. Continue glimeperide for now.

## 2012-06-06 NOTE — Assessment & Plan Note (Signed)
Check Cr today. 

## 2012-06-06 NOTE — Patient Instructions (Addendum)
Call Dr. Ollen Gross office today to see if you need to taper off prednisone or if you can just stop. Blood work today.  We will call you with results and plan. Return to see me in 4 months, soonoer if needed.

## 2012-06-06 NOTE — Assessment & Plan Note (Signed)
Pending starting new medication. I'm concerned he suddenlydiscontinued prednisone (prior on 5mg  bid for >1 mo).  I've asked him to call onc to verify ok to just discontinue suddenly.

## 2012-06-08 ENCOUNTER — Other Ambulatory Visit: Payer: Self-pay | Admitting: Family Medicine

## 2012-06-08 MED ORDER — GLUCOSE BLOOD VI STRP: ORAL_STRIP | Status: DC

## 2012-06-08 MED ORDER — ONETOUCH ULTRA SYSTEM W/DEVICE KIT: 1.0000 | PACK | Freq: Once | Status: DC

## 2012-07-05 ENCOUNTER — Encounter: Payer: Self-pay | Admitting: *Deleted

## 2012-07-05 ENCOUNTER — Telehealth: Payer: Self-pay | Admitting: *Deleted

## 2012-07-05 ENCOUNTER — Encounter (HOSPITAL_BASED_OUTPATIENT_CLINIC_OR_DEPARTMENT_OTHER): Payer: Self-pay | Admitting: *Deleted

## 2012-07-05 MED ORDER — PREGABALIN 75 MG PO CAPS: 75.0000 mg | ORAL_CAPSULE | Freq: Three times a day (TID) | ORAL | Status: DC

## 2012-07-05 MED ORDER — GLIMEPIRIDE 1 MG PO TABS: 1.0000 mg | ORAL_TABLET | Freq: Every day | ORAL | Status: DC

## 2012-07-05 NOTE — Progress Notes (Signed)
To [facility] at 0600 - Istat,Kub on arrival,Ekg,cardiac cath report with chart.Npo after Mn-will take amlodipine, lopressor with small amt water that am,instructed to refrain from smoking also.

## 2012-07-05 NOTE — Telephone Encounter (Signed)
Has he been taking lyrica?  Looks like this was removed from med list 04/2012.   Would suggest increase lyrica to 75mg  tid.  plz phone this in if pt interested. Alternatively could try a prescription vitamin for nerve function called metanx, but otherwise not many other good options for this.

## 2012-07-05 NOTE — Telephone Encounter (Signed)
Patient called and said his feet are feeling swollen even though they're not. He says they're also feeling numb. He says the Lyrica has not been much help, which you are aware of. Any suggestions?

## 2012-07-07 NOTE — Telephone Encounter (Signed)
Message left advising patient to return my call and advise what he would like to try.

## 2012-07-07 NOTE — Telephone Encounter (Signed)
Spoke with patient. He had not been taking Lyrica because he could never get to effective dose without being too drowsy/"loopy". He would like to try the metanx.

## 2012-07-09 NOTE — Telephone Encounter (Addendum)
Did not tolerate gabapentin either. I have sent in metanx.   However I'd like him to get input from his oncologist prior to starting this med.   Also, doesn't need to take separate B6 if he takes Metanx (included)

## 2012-07-10 MED ORDER — L-METHYLFOLATE-B6-B12 3-35-2 MG PO TABS: 1.0000 | ORAL_TABLET | Freq: Every day | ORAL | Status: DC

## 2012-07-10 NOTE — Telephone Encounter (Signed)
Message left for patient to check with oncologist and if okay to pick up med from pharmacy and stop separate B6 vitamin.

## 2012-07-13 ENCOUNTER — Encounter: Payer: Self-pay | Admitting: Family Medicine

## 2012-07-13 NOTE — Anesthesia Preprocedure Evaluation (Addendum)
Anesthesia Evaluation  Patient identified by MRN, date of birth, ID band Patient awake    Reviewed: Allergy & Precautions, H&P , NPO status , Patient's Chart, lab work & pertinent test results  Airway Mallampati: II TM Distance: >3 FB Neck ROM: full    Dental  (+) Caps and Dental Advisory Given Cap right upper front:   Pulmonary neg pulmonary ROS, Current Smoker,  breath sounds clear to auscultation  Pulmonary exam normal       Cardiovascular Exercise Tolerance: Good hypertension, Pt. on home beta blockers + CAD and + Past MI Rhythm:regular Rate:Normal  03/2012 Cath showed occluded mid left circumflex and minor irregularities in the LAD and RCA.  EF 55%   Neuro/Psych negative neurological ROS  negative psych ROS   GI/Hepatic negative GI ROS, Neg liver ROS,   Endo/Other  diabetes, Well Controlled, Type 2, Oral Hypoglycemic Agents  Renal/GU negative Renal ROS  negative genitourinary   Musculoskeletal   Abdominal   Peds  Hematology negative hematology ROS (+)   Anesthesia Other Findings   Reproductive/Obstetrics negative OB ROS                          Anesthesia Physical Anesthesia Plan  ASA: III  Anesthesia Plan: General   Post-op Pain Management:    Induction: Intravenous  Airway Management Planned: LMA  Additional Equipment:   Intra-op Plan:   Post-operative Plan:   Informed Consent: I have reviewed the patients History and Physical, chart, labs and discussed the procedure including the risks, benefits and alternatives for the proposed anesthesia with the patient or authorized representative who has indicated his/her understanding and acceptance.   Dental Advisory Given  Plan Discussed with: CRNA and Surgeon  Anesthesia Plan Comments:         Anesthesia Quick Evaluation

## 2012-07-13 NOTE — H&P (Signed)
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   History of Present Illness This is a 62 year old patient seen in follow up for evaluation of prostate cancer.  Date of diagnosis: 11/12  Type of treatment:  Androgen Deprivation, Leuprolide acetate  Pretreatment PSA: 59.66  Gleason score: 5+4=9                 He initially underwent a CT scan and bone scan which he reports were negative. He was started on [facility] agonist therapy indicating he likely had locally advanced disease. A CT scan done on 09/24/11 revealed diffuse retroperitoneal and pelvic adenopathy with focal sclerosis at T10 and L4 consistent with bony metastatic disease as well as a lobulated mass in the pelvis consistent with local extension. He has been under the care of an oncologist in [place], Dr. Koleen Nimrod, who had maintained him on Lupron. His PSA nadar in 7/13 was 5.1. In 8/13 it had increased to 7.3. I started Casodex during his hospitalization. He has since been placed on Zytiga and prednisone.  Right hydronephrosis: He was seen in the hospital in 8/13 do to an elevated creatinine of 3.5. He had some mild renal insufficiency with a baseline creatinine of approximately 1.4. He was found to have right hydronephrosis with some mild left renal atrophy and therefore had a percutaneous nephrostomy tube placed which was eventually internalized to a double-J stent. This resulted in normalization of his creatinine back down to 1.32 in 9/13. 1/14 - Rt. stent change  Interval history: Urologically he's been doing pretty well. He said he occasionally has a slight bit of blood in his urine. No gross clots or significant bleeding. He's not having any pain from his stent. He did just undergo a full cardiac workup including cardiac cath. He apparently has mild single vessel disease.    Past Medical History Problems  1. History of  Coronary Artery Disease V12.59 2. History of  Diabetes Mellitus  250.00 3. History of  Hypertension 401.9  Surgical History Problems  1. History of  Cystoscopy With Insertion Of Ureteral Stent Right 2. History of  Wrist Surgery Right  Current Meds 1. AmLODIPine Besylate 10 MG Oral Tablet; Therapy: 01Feb2013 to  Allergies Medication  1. No Known Drug Allergies  Family History Problems  1. Family history of  Acute Myocardial Infarction V17.3 2. Family history of  Breast Cancer V16.3 3. Family history of  Cancer 4. Family history of  Prostate Cancer V16.42  Social History Problems  1. Caffeine Use 2. Current Smoker 305.1 1/2ppd x 10 3. Marital History - Currently Married Denied  4. History of  Alcohol Use  Review of Systems: A comprehensive review of systems was negative except as noted in HPI. Physical Exam: General: Alert, awake, oriented x3, in no acute distress.    HEENT: [place]/AT PEERL, EOMI OROPHARYNX:  Moist, No exudate/ erythema/lesions.    Heart: Regular rate and rhythm, without murmurs, rubs, gallops.    Lungs: Clear to auscultation, no wheezing or rhonchi noted.     Abdomen: Soft, nontender, nondistended, positive bowel sounds, no masses no hepatosplenomegaly noted.    Neuro: No focal neurological deficits noted cranial nerves II through XII grossly intact. Musculoskeletal: No warm swelling or erythema around joints, no spinal tenderness noted. Psychiatric: Patient alert and oriented x3, good insight and cognition, good recent to remote recall.  Vitals Vital Signs  BMI Calculated: 29.66 BSA Calculated: 2.22 Height: 6 ft  Weight: 219 lb  Blood Pressure: 148 / 88 Heart Rate: 67  Physical Exam:   General appearance: alert and appears stated age Head: Normocephalic, without obvious abnormality, atraumatic Eyes: conjunctivae/corneas clear. EOM's intact.   Oropharynx: moist mucous membranes Neck: supple, symmetrical, trachea midline Resp: normal respiratory effort Cardio: regular rate and rhythm Back: symmetric, no  curvature. ROM normal. No CVA tenderness. GI: soft, non-tender; bowel sounds normal; no masses,  no organomegaly Male genitalia: penis: normal male phallus with no lesions or discharge.he is a Foley catheter indwelling and it is draining clear urine.Testes: bilaterally descended with no masses or tenderness. no hernias Rectal reveals normal sphincter tone. His prostate is enlarged, hard, fixed and quite nodular. Extremities: extremities normal, atraumatic, no cyanosis or edema Skin: Skin color normal. No visible rashes or lesions Neurologic: Grossly normal  The following images/tracing/specimen were independently visualized:  Renal ultrasound: The right kidney was imaged and noted to be 12.9 cm with 60 mm of parenchyma. Several cysts were noted within the kidney and the stent was identified within the renal pelvis. There is no evidence of significant hydronephrosis.  .    Assessment Assessed  1. Ureteral Obstruction Right 593.4 2. Hydronephrosis On The Right 591 3. Rule out  Acute Cystitis 595.0 4. Adenocarcinoma Of The Prostate Gland 185       He has no evidence of hydronephrosis on the right side today. At the time of his stent change I noted no significant encrustation of the stent either proximally or distally on the exterior of the stent nor was there any appreciable buildup of material within the lumen of the stent. We therefore discussed the fact that it appears he does not encrusted stents quickly and therefore since it was 3 months before I changed his stent the first time I told him I'd like to wait another 2 months and change it at 5 months. I will reassess at that time and we could possibly go to a little bit longer interval between stent changes if things still look good at that time.  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 He has an excellent performance status at this time  and is completely asymptomatic. He does now however understand that his disease is not curable.   Plan   He is scheduled for stent change.

## 2012-07-14 ENCOUNTER — Encounter (HOSPITAL_BASED_OUTPATIENT_CLINIC_OR_DEPARTMENT_OTHER)
Admission: RE | Disposition: A | Discharge status: Home / Self Care | Payer: Self-pay | Source: Ambulatory Visit | Attending: Urology

## 2012-07-14 ENCOUNTER — Encounter (HOSPITAL_BASED_OUTPATIENT_CLINIC_OR_DEPARTMENT_OTHER): Payer: Self-pay | Admitting: Anesthesiology

## 2012-07-14 ENCOUNTER — Ambulatory Visit (HOSPITAL_COMMUNITY): Payer: Managed Care, Other (non HMO)

## 2012-07-14 ENCOUNTER — Ambulatory Visit (HOSPITAL_BASED_OUTPATIENT_CLINIC_OR_DEPARTMENT_OTHER): Payer: Managed Care, Other (non HMO) | Admitting: Anesthesiology

## 2012-07-14 ENCOUNTER — Ambulatory Visit (HOSPITAL_BASED_OUTPATIENT_CLINIC_OR_DEPARTMENT_OTHER)
Admission: RE | Admit: 2012-07-14 | Discharge: 2012-07-14 | Disposition: A | Discharge status: Home / Self Care | Payer: Managed Care, Other (non HMO) | Source: Ambulatory Visit | Attending: Urology | Admitting: Urology

## 2012-07-14 ENCOUNTER — Encounter (HOSPITAL_BASED_OUTPATIENT_CLINIC_OR_DEPARTMENT_OTHER): Payer: Self-pay | Admitting: *Deleted

## 2012-07-14 DIAGNOSIS — C7952 Secondary malignant neoplasm of bone marrow: Secondary | ICD-10-CM | POA: Insufficient documentation

## 2012-07-14 DIAGNOSIS — I10 Essential (primary) hypertension: Secondary | ICD-10-CM | POA: Insufficient documentation

## 2012-07-14 DIAGNOSIS — I2510 Atherosclerotic heart disease of native coronary artery without angina pectoris: Secondary | ICD-10-CM | POA: Insufficient documentation

## 2012-07-14 DIAGNOSIS — N1330 Unspecified hydronephrosis: Secondary | ICD-10-CM | POA: Insufficient documentation

## 2012-07-14 DIAGNOSIS — R319 Hematuria, unspecified: Secondary | ICD-10-CM | POA: Insufficient documentation

## 2012-07-14 DIAGNOSIS — Z79899 Other long term (current) drug therapy: Secondary | ICD-10-CM | POA: Insufficient documentation

## 2012-07-14 DIAGNOSIS — E119 Type 2 diabetes mellitus without complications: Secondary | ICD-10-CM | POA: Insufficient documentation

## 2012-07-14 DIAGNOSIS — N135 Crossing vessel and stricture of ureter without hydronephrosis: Secondary | ICD-10-CM | POA: Insufficient documentation

## 2012-07-14 DIAGNOSIS — N289 Disorder of kidney and ureter, unspecified: Secondary | ICD-10-CM | POA: Insufficient documentation

## 2012-07-14 DIAGNOSIS — IMO0002 Reserved for concepts with insufficient information to code with codable children: Secondary | ICD-10-CM | POA: Insufficient documentation

## 2012-07-14 DIAGNOSIS — C7951 Secondary malignant neoplasm of bone: Secondary | ICD-10-CM | POA: Insufficient documentation

## 2012-07-14 DIAGNOSIS — C61 Malignant neoplasm of prostate: Secondary | ICD-10-CM | POA: Insufficient documentation

## 2012-07-14 HISTORY — PX: CYSTOSCOPY W/ URETERAL STENT PLACEMENT: SHX1429

## 2012-07-14 LAB — POCT I-STAT, CHEM 8
BUN: 25 mg/dL — ABNORMAL HIGH (ref 6–23)
Calcium, Ion: 1.18 mmol/L (ref 1.13–1.30)
Chloride: 104 mEq/L (ref 96–112)
Creatinine, Ser: 1.5 mg/dL — ABNORMAL HIGH (ref 0.50–1.35)
Glucose, Bld: 87 mg/dL (ref 70–99)
HCT: 43 % (ref 39.0–52.0)
Hemoglobin: 14.6 g/dL (ref 13.0–17.0)
Potassium: 4 mEq/L (ref 3.5–5.1)
Sodium: 138 mEq/L (ref 135–145)
TCO2: 29 mmol/L (ref 0–100)

## 2012-07-14 LAB — GLUCOSE, CAPILLARY: Glucose-Capillary: 108 mg/dL — ABNORMAL HIGH (ref 70–99)

## 2012-07-14 SURGERY — CYSTOSCOPY, WITH RETROGRADE PYELOGRAM AND URETERAL STENT INSERTION
Anesthesia: General | Site: Ureter | Laterality: Right | Wound class: Clean Contaminated

## 2012-07-14 MED ORDER — FENTANYL CITRATE 0.05 MG/ML IJ SOLN
INTRAMUSCULAR | Status: DC | PRN
  Administered 2012-07-14: 25 ug via INTRAVENOUS
  Administered 2012-07-14: 50 ug via INTRAVENOUS
  Administered 2012-07-14: 25 ug via INTRAVENOUS
  Administered 2012-07-14: 50 ug via INTRAVENOUS
  Administered 2012-07-14 (×2): 25 ug via INTRAVENOUS

## 2012-07-14 MED ORDER — MIRABEGRON ER 50 MG PO TB24: 25.0000 mg | ORAL_TABLET | Freq: Every day | ORAL | Status: DC

## 2012-07-14 MED ORDER — IOHEXOL 350 MG/ML SOLN
INTRAVENOUS | Status: DC | PRN
  Administered 2012-07-14: 8 mL

## 2012-07-14 MED ORDER — LACTATED RINGERS IV SOLN
INTRAVENOUS | Status: DC
  Administered 2012-07-14: 07:00:00 via INTRAVENOUS
  Filled 2012-07-14: qty 1000

## 2012-07-14 MED ORDER — ONDANSETRON HCL 4 MG/2ML IJ SOLN
INTRAMUSCULAR | Status: DC | PRN
  Administered 2012-07-14: 4 mg via INTRAVENOUS

## 2012-07-14 MED ORDER — MIDAZOLAM HCL 5 MG/5ML IJ SOLN
INTRAMUSCULAR | Status: DC | PRN
  Administered 2012-07-14: 2 mg via INTRAVENOUS

## 2012-07-14 MED ORDER — KETOROLAC TROMETHAMINE 30 MG/ML IJ SOLN
INTRAMUSCULAR | Status: DC | PRN
  Administered 2012-07-14: 30 mg via INTRAVENOUS

## 2012-07-14 MED ORDER — PROPOFOL 10 MG/ML IV BOLUS
INTRAVENOUS | Status: DC | PRN
  Administered 2012-07-14: 200 mg via INTRAVENOUS

## 2012-07-14 MED ORDER — MIRABEGRON ER 25 MG PO TB24: 25.0000 mg | ORAL_TABLET | Freq: Every day | ORAL | Status: DC

## 2012-07-14 MED ORDER — LACTATED RINGERS IV SOLN
INTRAVENOUS | Status: DC
  Filled 2012-07-14: qty 1000

## 2012-07-14 MED ORDER — DEXAMETHASONE SODIUM PHOSPHATE 4 MG/ML IJ SOLN
INTRAMUSCULAR | Status: DC | PRN
  Administered 2012-07-14: 4 mg via INTRAVENOUS

## 2012-07-14 MED ORDER — FENTANYL CITRATE 0.05 MG/ML IJ SOLN
25.0000 ug | INTRAMUSCULAR | Status: DC | PRN
  Filled 2012-07-14: qty 1

## 2012-07-14 MED ORDER — CIPROFLOXACIN IN D5W 400 MG/200ML IV SOLN
400.0000 mg | INTRAVENOUS | Status: AC
  Administered 2012-07-14: 400 mg via INTRAVENOUS
  Filled 2012-07-14: qty 200

## 2012-07-14 MED ORDER — STERILE WATER FOR IRRIGATION IR SOLN
Status: DC | PRN
  Administered 2012-07-14: 3000 mL

## 2012-07-14 MED ORDER — LIDOCAINE HCL (CARDIAC) 20 MG/ML IV SOLN
INTRAVENOUS | Status: DC | PRN
  Administered 2012-07-14: 100 mg via INTRAVENOUS

## 2012-07-14 MED ORDER — PHENAZOPYRIDINE HCL 200 MG PO TABS
200.0000 mg | ORAL_TABLET | Freq: Once | ORAL | Status: DC
  Filled 2012-07-14: qty 1

## 2012-07-14 SURGICAL SUPPLY — 24 items
ADAPTER CATH URET PLST 4-6FR (CATHETERS) IMPLANT
ADPR CATH URET STRL DISP 4-6FR (CATHETERS)
BAG DRAIN URO-CYSTO SKYTR STRL (DRAIN) ×2 IMPLANT
BAG DRN UROCATH (DRAIN) ×1
CANISTER SUCT LVC 12 LTR MEDI- (MISCELLANEOUS) ×2 IMPLANT
CATH INTERMIT  6FR 70CM (CATHETERS) IMPLANT
CATH URET 5FR 28IN CONE TIP (BALLOONS)
CATH URET 5FR 70CM CONE TIP (BALLOONS) IMPLANT
CLOTH BEACON ORANGE TIMEOUT ST (SAFETY) ×2 IMPLANT
DRAPE CAMERA CLOSED 9X96 (DRAPES) ×2 IMPLANT
GLOVE BIO SURGEON STRL SZ8 (GLOVE) ×2 IMPLANT
GLOVE ECLIPSE 6.5 STRL STRAW (GLOVE) ×2 IMPLANT
GLOVE INDICATOR 6.5 STRL GRN (GLOVE) ×2 IMPLANT
GOWN PREVENTION PLUS LG XLONG (DISPOSABLE) ×2 IMPLANT
GOWN STRL NON-REIN LRG LVL3 (GOWN DISPOSABLE) ×2 IMPLANT
GOWN STRL REIN XL XLG (GOWN DISPOSABLE) ×2 IMPLANT
GUIDEWIRE 0.038 PTFE COATED (WIRE) ×2 IMPLANT
GUIDEWIRE ANG ZIPWIRE 038X150 (WIRE) IMPLANT
GUIDEWIRE STR DUAL SENSOR (WIRE) ×2 IMPLANT
IV NS IRRIG 3000ML ARTHROMATIC (IV SOLUTION) IMPLANT
NS IRRIG 500ML POUR BTL (IV SOLUTION) IMPLANT
PACK CYSTOSCOPY (CUSTOM PROCEDURE TRAY) ×2 IMPLANT
STENT CONTOUR 7FRX24 (STENTS) ×2 IMPLANT
WATER STERILE IRR 3000ML UROMA (IV SOLUTION) ×2 IMPLANT

## 2012-07-14 NOTE — Anesthesia Procedure Notes (Signed)
Procedure Name: LMA Insertion Date/Time: 07/14/2012 7:31 AM Performed by: Jessica Priest Pre-anesthesia Checklist: Patient identified, Emergency Drugs available, Suction available and Patient being monitored Patient Re-evaluated:Patient Re-evaluated prior to inductionOxygen Delivery Method: Circle System Utilized Preoxygenation: Pre-oxygenation with 100% oxygen Intubation Type: IV induction Ventilation: Mask ventilation without difficulty LMA: LMA inserted LMA Size: 5.0 Number of attempts: 1 Airway Equipment and Method: bite block Placement Confirmation: positive ETCO2 Tube secured with: Tape Dental Injury: Teeth and Oropharynx as per pre-operative assessment

## 2012-07-14 NOTE — Anesthesia Postprocedure Evaluation (Signed)
  Anesthesia Post-op Note  Patient: Francisco Dunn  Procedure(s) Performed: Procedure(s) (LRB): RIGHT RETROGRADE PYELOGRAM/STENT CHANGE (Right)  Patient Location: PACU  Anesthesia Type: General  Level of Consciousness: awake and alert   Airway and Oxygen Therapy: Patient Spontanous Breathing  Post-op Pain: mild  Post-op Assessment: Post-op Vital signs reviewed, Patient's Cardiovascular Status Stable, Respiratory Function Stable, Patent Airway and No signs of Nausea or vomiting  Last Vitals:  Filed Vitals:   07/14/12 0830  BP: 136/80  Pulse: 68  Temp:   Resp: 11    Post-op Vital Signs: stable   Complications: No apparent anesthesia complications

## 2012-07-14 NOTE — Transfer of Care (Signed)
Immediate Anesthesia Transfer of Care Note  Patient: Francisco Dunn  Procedure(s) Performed: Procedure(s) (LRB): RIGHT RETROGRADE PYELOGRAM/STENT CHANGE (Right)  Patient Location: PACU  Anesthesia Type: General  Level of Consciousness: awake, sedated, patient cooperative and responds to stimulation  Airway & Oxygen Therapy: Patient Spontanous Breathing and Patient connected to face mask oxygen  Post-op Assessment: Report given to PACU RN, Post -op Vital signs reviewed and stable and Patient moving all extremities  Post vital signs: Reviewed and stable  Complications: No apparent anesthesia complications

## 2012-07-14 NOTE — Op Note (Signed)
PATIENT: Francisco Dunn  Preoperative diagnosis:  1. Right ureteral obstruction Postoperative diagnosis:  1. Same  Procedure:  1. Cystoscopy 2. Removal of right ureteral stent 3. Right retrograde pyelography with interpretation  4. Replacement of right ureteral stent (7 [place], 24 cm) Surgeon: Veverly Fells. Vernie Ammons, M.D.  Anesthesia: General  Complications: None  EBL: Minimal  Specimens: None   Indication: Francisco Dunn is a 62 year old patient with right ureteral obstruction secondary to metastatic prostate cancer. He previously had a right double-J stent placed after undergoing a percutaneous nephrostomy. He's done well and tolerated the stent. It has now been in about 5 months. It's time for its removal and replacement. The procedure was discussed with the patient in detail. He elects to proceed.   Description of procedure:  The patient was taken to the operating room and general anesthesia was induced. The patient was placed in the dorsal lithotomy position, prepped and draped in the usual sterile fashion, and preoperative antibiotics were administered. A preoperative time-out was performed.  Cystourethroscopy was performed. The patient's urethra was examined and was normal/ demonstrated bilobar prostatic hypertrophy/ bilobar prostatic hypertrophy with a median lobe that was slightly enlarged as well. In addition I noted some nodular growth of the prostate at the bladder neck protruding into the bladder anteriorly. The bladder was then systematically examined in its entirety. There was no evidence for any bladder tumors, stones, or other mucosal pathology. His right stent was noted to be exiting his right ureteral orifice.  I grasped the stent with alligator graspers and withdrew it through the urethral meatus. I then passed a 0.038 inch floppy-tipped guidewire through the stent and into the area the renal pelvis removing the stent I left the guidewire in place. Over the guidewire I then passed a 6  [place] open-ended ureteral catheter up to the level of the renal pelvis and removed the guidewire. A retrograde pyelogram was then performed.  Retrograde pyelography was performed by injecting full-strength contrast through the open-ended catheter under direct fluoroscopy. It revealed a dilated renal pelvis and blunting of the calyces. I then backed the open-ended catheter down the ureter while injecting contrast simultaneously and noted a marked stricturing of the ureter several centimeters up the ureter from the ureterovesical junction. There appeared to be what looked like possible intraluminal growth versus significant external compression. Ureteroscopy was not indicated to determine the exact cause as it would not change the course of treatment. The guidewire was then advanced through the open-ended catheter back into the area of the renal pelvis and the open-ended catheter was removed. The  The double-J stent was passed over the guidewire into the area the renal pelvis and as the guidewire was removed good curl was noted in the area of the pelvis and good curl of the distal aspect of the stent was noted in the bladder. The bladder was then emptied and the procedure ended. The patient appeared to tolerate the procedure well and without complications. The patient was able to be awakened and transferred to the recovery unit in satisfactory condition.

## 2012-07-14 NOTE — Interval H&P Note (Signed)
History and Physical Interval Note:  07/14/2012 7:20 AM  Francisco Dunn  has presented today for surgery, with the diagnosis of RIGHT URETHERAL OBSTRUCTION  The various methods of treatment have been discussed with the patient and family. After consideration of risks, benefits and other options for treatment, the patient has consented to  Procedure(s): RIGHT RETROGRADE PYELOGRAM/STENT CHANGE (Right) as a surgical intervention .  The patient's history has been reviewed, patient examined, no change in status, stable for surgery.  I have reviewed the patient's chart and labs.  Questions were answered to the patient's satisfaction.     Garnett Farm

## 2012-07-17 ENCOUNTER — Encounter (HOSPITAL_BASED_OUTPATIENT_CLINIC_OR_DEPARTMENT_OTHER): Payer: Self-pay | Admitting: Urology

## 2012-07-17 ENCOUNTER — Ambulatory Visit: Payer: Self-pay | Admitting: Oncology

## 2012-07-18 LAB — CBC CANCER CENTER
Basophil #: 0.1 x10 3/mm (ref 0.0–0.1)
Eosinophil #: 0.5 x10 3/mm (ref 0.0–0.7)
Eosinophil %: 5.1 %
HCT: 42.2 % (ref 40.0–52.0)
HGB: 14.6 g/dL (ref 13.0–18.0)
Lymphocyte #: 1.8 x10 3/mm (ref 1.0–3.6)
MCHC: 34.5 g/dL (ref 32.0–36.0)
MCV: 87 fL (ref 80–100)
Monocyte #: 1.1 x10 3/mm — ABNORMAL HIGH (ref 0.2–1.0)
Monocyte %: 12.6 %
Platelet: 288 x10 3/mm (ref 150–440)
RBC: 4.86 10*6/uL (ref 4.40–5.90)
RDW: 13.8 % (ref 11.5–14.5)

## 2012-07-18 LAB — COMPREHENSIVE METABOLIC PANEL
Bilirubin,Total: 0.4 mg/dL (ref 0.2–1.0)
Calcium, Total: 9.3 mg/dL (ref 8.5–10.1)
Co2: 29 mmol/L (ref 21–32)
Creatinine: 1.88 mg/dL — ABNORMAL HIGH (ref 0.60–1.30)
EGFR (Non-African Amer.): 37 — ABNORMAL LOW
Glucose: 157 mg/dL — ABNORMAL HIGH (ref 65–99)
SGOT(AST): 14 U/L — ABNORMAL LOW (ref 15–37)
Total Protein: 7.4 g/dL (ref 6.4–8.2)

## 2012-07-19 LAB — PSA: PSA: 35.5 ng/mL — ABNORMAL HIGH (ref 0.0–4.0)

## 2012-07-31 ENCOUNTER — Encounter: Payer: Self-pay | Admitting: *Deleted

## 2012-07-31 ENCOUNTER — Telehealth: Payer: Self-pay | Admitting: *Deleted

## 2012-07-31 NOTE — Telephone Encounter (Signed)
Patient called and said the Metanx has not been any help to his feet/legs. They haven't gotten any worse, but he has had no improvement. Any other suggestions? He is aware you are out of the office today and said it is no rush for a reply.

## 2012-07-31 NOTE — Telephone Encounter (Signed)
Ongoing since around 08/2011. Has tried/failed gabapentin, lyrica, and now metanx. No clear etiology - but known h/o DM and possible prostate cancer mets to spine. I'd like him to come in to discuss further - may order nerve studies.

## 2012-08-01 ENCOUNTER — Ambulatory Visit: Payer: Self-pay | Admitting: Oncology

## 2012-08-01 NOTE — Telephone Encounter (Signed)
Message left for patient to call and schedule appt to discuss.

## 2012-08-03 ENCOUNTER — Encounter: Payer: Self-pay | Admitting: Family Medicine

## 2012-08-03 ENCOUNTER — Ambulatory Visit (INDEPENDENT_AMBULATORY_CARE_PROVIDER_SITE_OTHER): Payer: Managed Care, Other (non HMO) | Admitting: Family Medicine

## 2012-08-03 VITALS — BP 146/80 | HR 80 | Temp 98.2°F | Wt 213.5 lb

## 2012-08-03 DIAGNOSIS — R209 Unspecified disturbances of skin sensation: Secondary | ICD-10-CM

## 2012-08-03 MED ORDER — HYDROCHLOROTHIAZIDE 12.5 MG PO CAPS: 25.0000 mg | ORAL_CAPSULE | Freq: Every day | ORAL | Status: DC

## 2012-08-03 MED ORDER — HYDROCHLOROTHIAZIDE 25 MG PO TABS: 25.0000 mg | ORAL_TABLET | Freq: Every day | ORAL | Status: DC

## 2012-08-03 NOTE — Patient Instructions (Signed)
May stop metanx. Let's increase hydrochlorothiazide to 25mg  daily - see if this helps leg swelling. Watch for leg cramps - ensure you are getting good potassium in diet. Good to see you today.  call me in 1 month for update on how feet are doing.

## 2012-08-03 NOTE — Progress Notes (Signed)
  Subjective:    Patient ID: Francisco Dunn, male    DOB: February 01, 1951, 62 y.o.   MRN: [id number]  HPI CC: foot tightness  H/o prostate cancer with mets to liver and spine.  On enzalutamide (xtandi) which has caused loss of taste.  Also with sore breasts and bloating from this med. On sanctura for overactive bladder for the last week.  Foot sensation - described as swelling and tightness in feet.  Ongoing since around 08/2011.  Has tried/failed gabapentin, lyrica, and now metanx.  Foot issue not waking him up.  Not significantly bothersome during the day. No clear etiology - but known h/o DM and prostate cancer mets to spine.  Denies burning or tingling, nor numbness really.  Not typical parestheisa sxs.   Past Medical History  Diagnosis Date  . Diabetes type 2, controlled     diet controlled  . Mixed hyperlipidemia   . HTN (hypertension)   . 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 Renae Fickle (onc), added casodex, now on abiaterone+prednisone (Ottelin)  . Coronary artery disease 03/2012    Abnormal nuclear stress test with small ischemia in the inferior lateral wall. Cardiac catheterization showed occluded mid left circumflex with minor irregularities in the LAD and RCA. Ejection fraction was 55%. Attempted LCX PCI but was not able to cross the lesion with a wire likely due to chronic state  . Hydronephrosis, right     stent replaced Q5 mo     Review of Systems Per HPI    Objective:   Physical Exam  Nursing note and vitals reviewed. Constitutional: He appears well-developed and well-nourished. No distress.  Musculoskeletal: He exhibits edema (tr to 1+ bilateral pedal edema).  Diabetic foot exam: Normal inspection No skin breakdown Calluses on soles diminished DP/PT pulses Normal sensation to light touch and monofilament Nails thickened       Assessment & Plan:

## 2012-08-03 NOTE — Assessment & Plan Note (Signed)
Actually on further discussion with patient, sounds like this is more of an altered sensation in feet issue, not necessarily paresthesia issue.  May stop metanx. Some leg swelling noted today - along with "tightness/swelling" sensation, will see if increase HCTZ will improve sxs. I wonder if spine mets contributing to altered sensation. Pt agrees with plan, will call me with update in 1 mo.

## 2012-08-11 ENCOUNTER — Ambulatory Visit (INDEPENDENT_AMBULATORY_CARE_PROVIDER_SITE_OTHER): Payer: Managed Care, Other (non HMO) | Admitting: Cardiovascular Disease

## 2012-08-11 ENCOUNTER — Encounter: Payer: Self-pay | Admitting: Cardiovascular Disease

## 2012-08-11 VITALS — BP 158/101 | HR 78 | Ht 72.0 in | Wt 213.2 lb

## 2012-08-11 DIAGNOSIS — I2510 Atherosclerotic heart disease of native coronary artery without angina pectoris: Secondary | ICD-10-CM

## 2012-08-11 DIAGNOSIS — I10 Essential (primary) hypertension: Secondary | ICD-10-CM

## 2012-08-11 MED ORDER — CARVEDILOL 12.5 MG PO TABS: 12.5000 mg | ORAL_TABLET | Freq: Two times a day (BID) | ORAL | Status: DC

## 2012-08-11 NOTE — Assessment & Plan Note (Signed)
His blood pressure is still elevated. I will change metoprolol to carvedilol 12.5 mg twice daily. I asked him to continue monitoring his blood pressure and update [place] if the blood pressure remains elevated. He did have previous history of worsening renal function with lisinopril. There is a possibility of underlying renal artery stenosis and this might need to be investigated in the future with duplex US.

## 2012-08-11 NOTE — Assessment & Plan Note (Signed)
He is stable from a cardiac standpoint with no recurrent angina. Continue medical therapy.

## 2012-08-11 NOTE — Progress Notes (Signed)
HPI  Mr. Wach is a very pleasant 62 year old gentleman who is here today for followup visit regarding coronary artery disease. He has a history of metastatic prostate cancer, hypertension and hyperlipidemia. He was seen in 03/2012 for substernal chest tightness. He was noted to have an abnormal EKG with inferior Q waves. He underwent a nuclear stress test which showed small area of ischemia in the inferolateral wall with borderline reduced LV systolic function. He underwent cardiac catheterization  which showed minor irregularities in the LAD and RCA. The left circumflex was occluded in the midsegment. We were not certain about the duration of occlusion. Thus, I attempted PCI on the left circumflex but was not able to cross the lesion likely due to a chronic state. I was able to see the distal vessel after creating a small channel. The distal vessel was overall small to medium in size. Due to that, medical therapy is recommended. He has been treated medically and reports no recurrent chest pain. His blood pressure continues to run high. Lisinopril in the past caused worsening chronic kidney disease with increase of creatinine from 1.4 to 2. He continues to have problems with overactive bladder.. he continues to smoke and is not interested in smoking cessation.    Allergies  Allergen Reactions  . Contrast Media (Iodinated Diagnostic Agents) Nausea And Vomiting    Felt unwell 2 hours post dye with nausea and general feeling unwell  . Lisinopril Other (See Comments)    creatinine elevation 1.4->2.0     Current Outpatient Prescriptions on File Prior to Visit  Medication Sig Dispense Refill  . amLODipine (NORVASC) 10 MG tablet Take 10 mg by mouth daily with breakfast.      . aspirin EC 81 MG tablet Take 81 mg by mouth daily.      Marland Kitchen atorvastatin (LIPITOR) 20 MG tablet Take 1 tablet (20 mg total) by mouth daily.  90 tablet  3  . Blood Glucose Monitoring Suppl (ONE TOUCH ULTRA SYSTEM KIT) W/DEVICE  KIT 1 kit by Does not apply route once.  1 each  0  . enzalutamide (XTANDI) 40 MG capsule Take 160 mg by mouth daily.      Marland Kitchen glimepiride (AMARYL) 1 MG tablet Take 1 tablet (1 mg total) by mouth daily before breakfast.  30 tablet  6  . glucose blood (ONE TOUCH TEST STRIPS) test strip Use as instructed.  Check sugars once daily. 250.02  100 each  11  . hydrochlorothiazide (HYDRODIURIL) 25 MG tablet Take 1 tablet (25 mg total) by mouth daily.  30 tablet  11  . Multiple Vitamin (MULTIVITAMIN) tablet Take 1 tablet by mouth daily.        . trospium (SANCTURA) 20 MG tablet Take 20 mg by mouth 2 (two) times daily.      . vitamin C (ASCORBIC ACID) 500 MG tablet Take 500 mg by mouth daily.       No current facility-administered medications on file prior to visit.     Past Medical History  Diagnosis Date  . Diabetes type 2, controlled     diet controlled  . Mixed hyperlipidemia   . HTN (hypertension)   . 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 Renae Fickle (onc), added casodex, now on abiaterone+prednisone (Ottelin)  . Coronary artery disease 03/2012    Abnormal nuclear stress test with small ischemia in the inferior lateral wall. Cardiac  catheterization showed occluded mid left circumflex with minor irregularities in the LAD and RCA. Ejection fraction was 55%. Attempted LCX PCI but was not able to cross the lesion with a wire likely due to chronic state  . Hydronephrosis, right     stent replaced Q5 mo     Past Surgical History  Procedure Laterality Date  . Tonsillectomy  1960's  . Wisdom tooth extraction  1968  . Carpal tunnel release  10/12/06    Right  . Hospitalization  09/2011    ARF with R hydro s/p perc nephrostomy, advanced prostate carcinoma with mets to lymph and spine  . Cystoscopy with stent placement  8/13    right, removal 02/2012 (Ottelin)  . Cystoscopy w/ ureteral stent placement   02/11/2012    Procedure: CYSTOSCOPY WITH RETROGRADE PYELOGRAM/URETERAL STENT PLACEMENT;  Surgeon: Garnett Farm, MD;  Location: [facility];  Service: Urology;  Laterality: Right;  NO URETEROSCOPY   . Cystoscopy w/ ureteral stent removal  02/11/2012    Procedure: CYSTOSCOPY WITH STENT REMOVAL;  Surgeon: Garnett Farm, MD;  Location: [facility];  Service: Urology;  Laterality: Right;  . Cardiac catheterization  03/2012    [facility]  . Cystoscopy w/ ureteral stent placement Right 07/14/2012    Procedure: RIGHT RETROGRADE PYELOGRAM/STENT CHANGE;  Surgeon: Garnett Farm, MD;  Location: [facility];  Service: Urology;  Laterality: Right;     Family History  Problem Relation Age of Onset  . Coronary artery disease Mother     PTCA  . Healthy Father   . Thyroid disease Brother   . Heart attack Maternal Grandfather   . Heart attack Paternal Grandfather   . Heart attack Paternal Grandmother   . Heart attack Paternal Aunt   . Diabetes      Both sides  . Prostate cancer Neg Hx   . Colon cancer Neg Hx   . Breast cancer Neg Hx      History   Social History  . Marital Status: Married    Spouse Name: N/A    Number of Children: 5  . Years of Education: N/A   Occupational History  . Not on file.   Social History Main Topics  . Smoking status: Current Every Day Smoker -- 0.30 packs/day for 20 years    Types: Cigarettes  . Smokeless tobacco: Never Used  . Alcohol Use: No  . Drug Use: No  . Sexually Active: Not on file   Other Topics Concern  . Not on file   Social History Narrative   Married   5 children--none in home   [place] 1 hour/2 x weekly   Skid wrapping/banding--by machine     PHYSICAL EXAM   BP 158/101  Pulse 78  Ht 6' (1.829 m)  Wt 213 lb 4 oz (96.73 kg)  BMI 28.92 kg/m2 Constitutional: He is oriented to person, place, and time. He appears well-developed and well-nourished. No distress.  HENT: No nasal discharge.    Head: Normocephalic and atraumatic.  Eyes: Pupils are equal and round. Right eye exhibits no discharge. Left eye exhibits no discharge.  Neck: Normal range of motion. Neck supple. No JVD present. No thyromegaly present.  Cardiovascular: Normal rate, regular rhythm, normal heart sounds and. Exam reveals no gallop and no friction rub. No murmur heard.  Pulmonary/Chest: Effort normal and breath sounds normal. No stridor. No respiratory distress. He has no wheezes. He has no rales. He exhibits no tenderness.  Abdominal:  Soft. Bowel sounds are normal. He exhibits no distension. There is no tenderness. There is no rebound and no guarding.  Musculoskeletal: Normal range of motion. He exhibits no edema and no tenderness.  Neurological: He is alert and oriented to person, place, and time. Coordination normal.  Skin: Skin is warm and dry. No rash noted. He is not diaphoretic. No erythema. No pallor.  Psychiatric: He has a normal mood and affect. His behavior is normal. Judgment and thought content normal.      EKG: Sinus  Rhythm  -Old inferior-apical infarct    ABNORMAL   ABNORMAL    ASSESSMENT AND PLAN

## 2012-08-11 NOTE — Patient Instructions (Addendum)
Stop Metoprolol.  Start Carvedilol 12.5 mg twice daily.   Call us in few weeks to update [place] about blood pressure.   Follow up in 6 months.

## 2012-08-21 LAB — CBC CANCER CENTER
Eosinophil %: 3.3 %
HGB: 13.8 g/dL (ref 13.0–18.0)
Lymphocyte #: 1.5 x10 3/mm (ref 1.0–3.6)
Lymphocyte %: 17.6 %
MCH: 29.8 pg (ref 26.0–34.0)
MCHC: 35.4 g/dL (ref 32.0–36.0)
Monocyte #: 1.1 x10 3/mm — ABNORMAL HIGH (ref 0.2–1.0)
Platelet: 258 x10 3/mm (ref 150–440)
RBC: 4.61 10*6/uL (ref 4.40–5.90)
RDW: 14.3 % (ref 11.5–14.5)
WBC: 8.8 x10 3/mm (ref 3.8–10.6)

## 2012-08-21 LAB — COMPREHENSIVE METABOLIC PANEL
Albumin: 3.1 g/dL — ABNORMAL LOW (ref 3.4–5.0)
Alkaline Phosphatase: 116 U/L (ref 50–136)
Anion Gap: 5 — ABNORMAL LOW (ref 7–16)
BUN: 26 mg/dL — ABNORMAL HIGH (ref 7–18)
Calcium, Total: 9.4 mg/dL (ref 8.5–10.1)
Chloride: 104 mmol/L (ref 98–107)
EGFR (African American): 48 — ABNORMAL LOW
EGFR (Non-African Amer.): 41 — ABNORMAL LOW
Osmolality: 281 (ref 275–301)
Potassium: 3.9 mmol/L (ref 3.5–5.1)
SGOT(AST): 19 U/L (ref 15–37)

## 2012-08-22 LAB — PSA: PSA: 20.5 ng/mL — ABNORMAL HIGH (ref 0.0–4.0)

## 2012-09-01 ENCOUNTER — Ambulatory Visit: Payer: Self-pay | Admitting: Oncology

## 2012-09-20 ENCOUNTER — Other Ambulatory Visit: Payer: Self-pay | Admitting: Family Medicine

## 2012-10-09 ENCOUNTER — Ambulatory Visit (INDEPENDENT_AMBULATORY_CARE_PROVIDER_SITE_OTHER): Payer: Managed Care, Other (non HMO) | Admitting: Family Medicine

## 2012-10-09 ENCOUNTER — Ambulatory Visit: Payer: Self-pay | Admitting: Oncology

## 2012-10-09 ENCOUNTER — Ambulatory Visit: Payer: Managed Care, Other (non HMO) | Admitting: Family Medicine

## 2012-10-09 ENCOUNTER — Encounter: Payer: Self-pay | Admitting: Family Medicine

## 2012-10-09 VITALS — BP 138/80 | HR 68 | Temp 98.2°F | Wt 195.2 lb

## 2012-10-09 DIAGNOSIS — N3281 Overactive bladder: Secondary | ICD-10-CM | POA: Insufficient documentation

## 2012-10-09 DIAGNOSIS — C61 Malignant neoplasm of prostate: Secondary | ICD-10-CM

## 2012-10-09 DIAGNOSIS — I10 Essential (primary) hypertension: Secondary | ICD-10-CM

## 2012-10-09 DIAGNOSIS — F17200 Nicotine dependence, unspecified, uncomplicated: Secondary | ICD-10-CM

## 2012-10-09 DIAGNOSIS — N318 Other neuromuscular dysfunction of bladder: Secondary | ICD-10-CM

## 2012-10-09 DIAGNOSIS — N289 Disorder of kidney and ureter, unspecified: Secondary | ICD-10-CM

## 2012-10-09 LAB — CBC CANCER CENTER
Basophil %: 0.8 %
Eosinophil #: 0.5 x10 3/mm (ref 0.0–0.7)
Eosinophil %: 8.3 %
Lymphocyte %: 20.2 %
MCH: 29.3 pg (ref 26.0–34.0)
Monocyte %: 11.2 %
Neutrophil #: 3.9 x10 3/mm (ref 1.4–6.5)
Neutrophil %: 59.5 %
RBC: 4.65 10*6/uL (ref 4.40–5.90)
WBC: 6.6 x10 3/mm (ref 3.8–10.6)

## 2012-10-09 LAB — COMPREHENSIVE METABOLIC PANEL
Alkaline Phosphatase: 130 U/L (ref 50–136)
Bilirubin,Total: 0.3 mg/dL (ref 0.2–1.0)
Calcium, Total: 9.2 mg/dL (ref 8.5–10.1)
Chloride: 104 mmol/L (ref 98–107)
Co2: 29 mmol/L (ref 21–32)
EGFR (African American): 42 — ABNORMAL LOW
EGFR (Non-African Amer.): 36 — ABNORMAL LOW
Glucose: 143 mg/dL — ABNORMAL HIGH (ref 65–99)
Potassium: 3.3 mmol/L — ABNORMAL LOW (ref 3.5–5.1)
SGOT(AST): 17 U/L (ref 15–37)
SGPT (ALT): 14 U/L (ref 12–78)
Sodium: 141 mmol/L (ref 136–145)
Total Protein: 6.7 g/dL (ref 6.4–8.2)

## 2012-10-09 LAB — BASIC METABOLIC PANEL
BUN: 15 mg/dL (ref 6–23)
CO2: 29 mEq/L (ref 19–32)
Calcium: 9.2 mg/dL (ref 8.4–10.5)
Creatinine, Ser: 1.7 mg/dL — ABNORMAL HIGH (ref 0.4–1.5)
GFR: 42.71 mL/min — ABNORMAL LOW (ref >60.00)
Glucose, Bld: 111 mg/dL — ABNORMAL HIGH (ref 70–99)
Sodium: 141 mEq/L (ref 135–145)

## 2012-10-09 LAB — URINALYSIS, COMPLETE
Bilirubin,UR: NEGATIVE
Ketone: NEGATIVE
Nitrite: POSITIVE
Ph: 6 (ref 4.5–8.0)
RBC,UR: 103 /HPF (ref 0–5)
WBC UR: 166 /HPF (ref 0–5)

## 2012-10-09 NOTE — Assessment & Plan Note (Signed)
Followed by Dr. Vernie Ammons. Has not responded to several interventions so far per patient.  Now using depens regularly to prevent urinary accidents.

## 2012-10-09 NOTE — Assessment & Plan Note (Signed)
Chronic.  Follows regularly with onc and uro.  Stable period. Persistent fatigue, ?malignancy related.

## 2012-10-09 NOTE — Assessment & Plan Note (Signed)
Down to 5 cig/day. Encouraged continued cessation attempt

## 2012-10-09 NOTE — Patient Instructions (Signed)
Blood work today. Good to see you today, call us with questions. We may do a kidney artery to evaluate blood flow of kidnyes.

## 2012-10-09 NOTE — Assessment & Plan Note (Signed)
Chronic, stable. Continue med.  Improved with change in beta blocker.

## 2012-10-09 NOTE — Assessment & Plan Note (Addendum)
Check Cr today.  Discussed possibility of renal dopplers to eval for RAS in future (?cancer related) given sensitivity of renal function to ACEI in past.   Lab Results  Component Value Date   CREATININE 1.50* 07/14/2012

## 2012-10-09 NOTE — Progress Notes (Signed)
  Subjective:    Patient ID: Francisco Dunn, male    DOB: 1950/11/14, 62 y.o.   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 On enzalutamide (xtandi) which has caused loss of taste.  Has f/u with onc next week.  Persistent trouble with fatigue and daytime sleepiness.  Fatigue seemed to start after he started xtandi.  Continues working night shift.  Wt Readings from Last 3 Encounters:  10/09/12 195 lb 4 oz (88.565 kg)  08/11/12 213 lb 4 oz (96.73 kg)  08/03/12 213 lb 8 oz (96.843 kg)   Body mass index is 26.47 kg/(m^2).  overactive bladder - has tried several meds but nothing has helped.  Wonders about R ureter stent pushing on bladder.  Wears depens regularly.  DM - attributed to prolonged prednisone course. Does not check sugars - has glucose meter but doesn't check. No hypoglycemic sxs.  Persistent altered sensation.  Avoiding sweetened beverages. Lab Results  Component Value Date   HGBA1C 7.2* 06/06/2012    HTN - No HA, vision changes, CP/tightness, SOB, leg swelling. Compliant with bp meds.   CAD - seen by Dr. Kirke Corin. S/p L heart catheterization - found to have chronically occluded left Cx - not amenable to PCI.  Treating medically.  smoking - down to 5 cig/day. Declines flu shot today.   Past Medical History  Diagnosis Date  . Diabetes type 2, controlled     diet controlled  . Mixed hyperlipidemia   . HTN (hypertension)   . 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 Renae Fickle (onc), added casodex, now on abiaterone+prednisone (Ottelin)  . Coronary artery disease 03/2012    Abnormal nuclear stress test with small ischemia in the inferior lateral wall. Cardiac catheterization showed occluded mid left circumflex with minor irregularities in the LAD and RCA. Ejection fraction was 55%. Attempted LCX PCI but was not able to cross the lesion with a wire  likely due to chronic state  . Hydronephrosis, right     stent replaced Q5 mo     Review of Systems Per HPI    Objective:   Physical Exam  Nursing note and vitals reviewed. Constitutional: He appears well-developed and well-nourished. No distress.  HENT:  Mouth/Throat: Oropharynx is clear and moist. No oropharyngeal exudate.  Cardiovascular: Normal rate, regular rhythm, normal heart sounds and intact distal pulses.   No murmur heard. Pulmonary/Chest: Effort normal and breath sounds normal. No respiratory distress. He has no wheezes. He has no rales.  Abdominal: Soft. Bowel sounds are normal. He exhibits no distension. There is no tenderness.  No abd/renal bruit appreciated today  Musculoskeletal: He exhibits no edema (trace).  Skin: Skin is warm and dry. No rash noted.       Assessment & Plan:

## 2012-10-09 NOTE — Assessment & Plan Note (Addendum)
Prior thought related to prednisone use - check A1c, if well controlled, consider d/c amaryl. Lab Results  Component Value Date   HGBA1C 7.2* 06/06/2012  pt has glucose meter at home, but doesn't check sugars 2/2 discomfort of finger stick.

## 2012-10-10 ENCOUNTER — Encounter: Payer: Self-pay | Admitting: Family Medicine

## 2012-10-10 LAB — PSA: PSA: 19.1 ng/mL — ABNORMAL HIGH (ref 0.0–4.0)

## 2012-10-11 ENCOUNTER — Encounter: Payer: Self-pay | Admitting: *Deleted

## 2012-11-01 ENCOUNTER — Ambulatory Visit: Payer: Self-pay | Admitting: Oncology

## 2012-11-06 LAB — COMPREHENSIVE METABOLIC PANEL
Albumin: 3 g/dL — ABNORMAL LOW (ref 3.4–5.0)
Alkaline Phosphatase: 124 U/L (ref 50–136)
Anion Gap: 12 (ref 7–16)
Bilirubin,Total: 0.5 mg/dL (ref 0.2–1.0)
Chloride: 98 mmol/L (ref 98–107)
Co2: 28 mmol/L (ref 21–32)
Creatinine: 1.91 mg/dL — ABNORMAL HIGH (ref 0.60–1.30)
EGFR (Non-African Amer.): 37 — ABNORMAL LOW
Glucose: 102 mg/dL — ABNORMAL HIGH (ref 65–99)
Potassium: 3.1 mmol/L — ABNORMAL LOW (ref 3.5–5.1)
SGOT(AST): 20 U/L (ref 15–37)
SGPT (ALT): 15 U/L (ref 12–78)
Sodium: 138 mmol/L (ref 136–145)

## 2012-11-06 LAB — CBC CANCER CENTER
Basophil %: 1 %
Eosinophil #: 0.3 x10 3/mm (ref 0.0–0.7)
Lymphocyte #: 1.4 x10 3/mm (ref 1.0–3.6)
MCHC: 34.1 g/dL (ref 32.0–36.0)
MCV: 87 fL (ref 80–100)
Monocyte #: 0.7 x10 3/mm (ref 0.2–1.0)
Monocyte %: 11.8 %
Neutrophil %: 57.8 %
Platelet: 333 x10 3/mm (ref 150–440)
RDW: 15.4 % — ABNORMAL HIGH (ref 11.5–14.5)
WBC: 5.7 x10 3/mm (ref 3.8–10.6)

## 2012-11-09 LAB — URINE CULTURE

## 2012-12-02 ENCOUNTER — Ambulatory Visit: Payer: Self-pay | Admitting: Oncology

## 2012-12-05 LAB — COMPREHENSIVE METABOLIC PANEL
Anion Gap: 11 (ref 7–16)
Bilirubin,Total: 0.3 mg/dL (ref 0.2–1.0)
Calcium, Total: 9.2 mg/dL (ref 8.5–10.1)
EGFR (African American): 30 — ABNORMAL LOW
EGFR (Non-African Amer.): 26 — ABNORMAL LOW
Glucose: 110 mg/dL — ABNORMAL HIGH (ref 65–99)
Osmolality: 286 (ref 275–301)
Potassium: 3.2 mmol/L — ABNORMAL LOW (ref 3.5–5.1)
SGOT(AST): 18 U/L (ref 15–37)
SGPT (ALT): 11 U/L — ABNORMAL LOW (ref 12–78)
Sodium: 140 mmol/L (ref 136–145)
Total Protein: 6.8 g/dL (ref 6.4–8.2)

## 2012-12-05 LAB — CBC CANCER CENTER
Basophil #: 0 x10 3/mm (ref 0.0–0.1)
Basophil %: 0.5 %
Eosinophil %: 5 %
HCT: 35.4 % — ABNORMAL LOW (ref 40.0–52.0)
Lymphocyte #: 1.4 x10 3/mm (ref 1.0–3.6)
Lymphocyte %: 20.3 %
MCH: 29.7 pg (ref 26.0–34.0)
MCV: 87 fL (ref 80–100)
Monocyte %: 13.4 %
Neutrophil #: 4.2 x10 3/mm (ref 1.4–6.5)
Neutrophil %: 60.8 %
RBC: 4.06 10*6/uL — ABNORMAL LOW (ref 4.40–5.90)

## 2012-12-14 ENCOUNTER — Encounter (HOSPITAL_COMMUNITY): Payer: Self-pay | Admitting: Pharmacy Technician

## 2012-12-14 ENCOUNTER — Other Ambulatory Visit: Payer: Self-pay | Admitting: Urology

## 2012-12-14 ENCOUNTER — Other Ambulatory Visit (HOSPITAL_COMMUNITY): Payer: Self-pay | Admitting: Urology

## 2012-12-14 LAB — COMPREHENSIVE METABOLIC PANEL
Albumin: 2.8 g/dL — ABNORMAL LOW (ref 3.4–5.0)
Alkaline Phosphatase: 141 U/L — ABNORMAL HIGH (ref 50–136)
Anion Gap: 12 (ref 7–16)
BUN: 28 mg/dL — ABNORMAL HIGH (ref 7–18)
Calcium, Total: 9.1 mg/dL (ref 8.5–10.1)
Creatinine: 3.15 mg/dL — ABNORMAL HIGH (ref 0.60–1.30)
EGFR (African American): 23 — ABNORMAL LOW
EGFR (Non-African Amer.): 20 — ABNORMAL LOW
Glucose: 165 mg/dL — ABNORMAL HIGH (ref 65–99)
Osmolality: 287 (ref 275–301)
Potassium: 3.3 mmol/L — ABNORMAL LOW (ref 3.5–5.1)
SGPT (ALT): 11 U/L — ABNORMAL LOW (ref 12–78)
Sodium: 139 mmol/L (ref 136–145)
Total Protein: 6.8 g/dL (ref 6.4–8.2)

## 2012-12-14 LAB — CBC CANCER CENTER
Eosinophil #: 0.2 x10 3/mm (ref 0.0–0.7)
Eosinophil %: 3 %
Lymphocyte #: 1.1 x10 3/mm (ref 1.0–3.6)
Lymphocyte %: 13 %
MCH: 30.1 pg (ref 26.0–34.0)
MCHC: 34 g/dL (ref 32.0–36.0)
MCV: 88 fL (ref 80–100)
Monocyte %: 13.3 %
RBC: 3.92 10*6/uL — ABNORMAL LOW (ref 4.40–5.90)
RDW: 15.7 % — ABNORMAL HIGH (ref 11.5–14.5)
WBC: 8.3 x10 3/mm (ref 3.8–10.6)

## 2012-12-14 NOTE — Progress Notes (Signed)
Last cardiac cath, stress test, chest x ray 2/14 [facility], LOV with EKG Dr Kirke Corin 7/14 [facility]

## 2012-12-15 ENCOUNTER — Encounter (HOSPITAL_COMMUNITY): Payer: Self-pay | Admitting: *Deleted

## 2012-12-15 NOTE — H&P (Signed)
Francisco Dunn is a 62 year old male with metastatic prostate cancer with a history of right ureteral obstruction.   History of Present Illness This is a 62 year old patient seen in follow up for evaluation of prostate cancer.  Date of diagnosis: 11/12  Type of treatment:  Androgen Deprivation, Leuprolide acetate  Pretreatment PSA: 59.66  Gleason score: 5+4=9                 He initially underwent a CT scan and bone scan which he reports were negative. He was started on [facility] agonist therapy indicating he likely had locally advanced disease. A CT scan done on 09/24/11 revealed diffuse retroperitoneal and pelvic adenopathy with focal sclerosis at T10 and L4 consistent with bony metastatic disease as well as a lobulated mass in the pelvis consistent with local extension. He has been under the care of an oncologist in [place], Dr. Koleen Nimrod, who had maintained him on Lupron. His PSA nadar in 7/13 was 5.1. In 8/13 it had increased to 7.3. I started Casodex during his hospitalization. He has since been placed on Zytiga and prednisone.  Right hydronephrosis: He was seen in the hospital in 8/13 do to an elevated creatinine of 3.5. He had some mild renal insufficiency with a baseline creatinine of approximately 1.4. He was found to have right hydronephrosis with some mild left renal atrophy and therefore had a percutaneous nephrostomy tube placed which was eventually internalized to a double-J stent. This resulted in normalization of his creatinine back down to 1.32 in 9/13. 1/14 - Rt. stent change  Interval history: Urologically he's been doing pretty well. He said he occasionally has a slight bit of blood in his urine. No gross clots or significant bleeding. He's not having any pain from his stent.  I spoke with Dr. Doylene Canning who was seeing Francisco Dunn in his office. He was creatinine has increased to 62.76 and a renal ultrasound was obtained which revealed bilateral hydronephrosis. He has prostate cancer is  progressing with a PSA of 29 and he has already undergone several therapies. He wanted to begin chemotherapy but needs to have his renal function optimized. I told him that he originally was scheduled to see me on 12/11/12 and my plan was to proceed with stent change on the right hand side but now that he's had an ultrasound and his creatinine is elevating I am going to proceed with scheduling him for surgery.    Past Medical History Problems  1. History of  Coronary Artery Disease V12.59 2. History of  Diabetes Mellitus 250.00 3. History of  Hypertension 401.9  Surgical History Problems  1. History of  Cystoscopy With Insertion Of Ureteral Stent Right 2. History of  Wrist Surgery Right  Current Meds 1. AmLODIPine Besylate 10 MG Oral Tablet; Therapy: 01Feb2013 to  Allergies Medication  1. No Known Drug Allergies  Family History Problems  1. Family history of  Acute Myocardial Infarction V17.3 2. Family history of  Breast Cancer V16.3 3. Family history of  Cancer 4. Family history of  Prostate Cancer V16.42  Social History Problems  1. Caffeine Use 2. Current Smoker 305.1 1/2ppd x 10 3. Marital History - Currently Married Denied  4. History of  Alcohol Use  Review of Systems: A comprehensive review of systems was negative except as noted in HPI. Physical Exam: General: Alert, awake, oriented x3, in no acute distress.     HEENT: [place]/AT PEERL, EOMI OROPHARYNX:  Moist, No exudate/ erythema/lesions.  Heart: Regular rate and rhythm, without murmurs, rubs, gallops.     Lungs: Clear to auscultation, no wheezing or rhonchi noted.      Abdomen: Soft, nontender, nondistended, positive bowel sounds, no masses no hepatosplenomegaly noted.     Neuro: No focal neurological deficits noted cranial nerves II through XII grossly intact. Musculoskeletal: No warm swelling or erythema around joints, no spinal tenderness noted. Psychiatric: Patient alert and oriented x3, good insight and  cognition, good recent to remote recall.  Vitals Vital Signs  BMI Calculated: 29.66 BSA Calculated: 2.22 Height: 6 ft  Weight: 219 lb  Blood Pressure: 148 / 88 Heart Rate: 67   Physical Exam:    General appearance: alert and appears stated age Head: Normocephalic, without obvious abnormality, atraumatic Eyes: conjunctivae/corneas clear. EOM's intact.    Oropharynx: moist mucous membranes Neck: supple, symmetrical, trachea midline Resp: normal respiratory effort Cardio: regular rate and rhythm Back: symmetric, no curvature. ROM normal. No CVA tenderness. GI: soft, non-tender; bowel sounds normal; no masses,  no organomegaly Male genitalia: penis: normal male phallus with no lesions or discharge.he is a Foley catheter indwelling and it is draining clear urine.Testes: bilaterally descended with no masses or tenderness. no hernias Rectal reveals normal sphincter tone. His prostate is enlarged, hard, fixed and quite nodular. Extremities: extremities normal, atraumatic, no cyanosis or edema Skin: Skin color normal. No visible rashes or lesions Neurologic: Grossly normal  Impression: History of right ureteral obstruction now with left ureteral obstruction and bilateral hydronephrosis by ultrasound as well as an elevated creatinine.  Plan: I am going to perform bilateral retrograde pyelograms and change his right stent and we'll also attempt to place a stent on the left-hand side. There is a possibility that his disease may have progressed locally to a point where I cannot visualize the ureteral orifice and he would therefore need to undergo percutaneous nephrostomy tube placement with eventual internalizing stent placed from above.

## 2012-12-18 ENCOUNTER — Encounter (HOSPITAL_COMMUNITY): Payer: Managed Care, Other (non HMO) | Admitting: Certified Registered Nurse Anesthetist

## 2012-12-18 ENCOUNTER — Encounter (HOSPITAL_COMMUNITY)
Admission: RE | Disposition: A | Discharge status: Home / Self Care | Payer: Self-pay | Source: Ambulatory Visit | Attending: Urology

## 2012-12-18 ENCOUNTER — Ambulatory Visit (HOSPITAL_COMMUNITY): Payer: Managed Care, Other (non HMO) | Admitting: Certified Registered Nurse Anesthetist

## 2012-12-18 ENCOUNTER — Ambulatory Visit (HOSPITAL_COMMUNITY): Payer: Managed Care, Other (non HMO)

## 2012-12-18 ENCOUNTER — Encounter (HOSPITAL_COMMUNITY): Payer: Self-pay | Admitting: *Deleted

## 2012-12-18 ENCOUNTER — Ambulatory Visit (HOSPITAL_COMMUNITY)
Admission: RE | Admit: 2012-12-18 | Discharge: 2012-12-18 | Disposition: A | Discharge status: Home / Self Care | Payer: Managed Care, Other (non HMO) | Source: Ambulatory Visit | Attending: Urology | Admitting: Urology

## 2012-12-18 DIAGNOSIS — I129 Hypertensive chronic kidney disease with stage 1 through stage 4 chronic kidney disease, or unspecified chronic kidney disease: Secondary | ICD-10-CM | POA: Insufficient documentation

## 2012-12-18 DIAGNOSIS — N135 Crossing vessel and stricture of ureter without hydronephrosis: Secondary | ICD-10-CM | POA: Diagnosis present

## 2012-12-18 DIAGNOSIS — E119 Type 2 diabetes mellitus without complications: Secondary | ICD-10-CM | POA: Insufficient documentation

## 2012-12-18 DIAGNOSIS — N189 Chronic kidney disease, unspecified: Secondary | ICD-10-CM | POA: Insufficient documentation

## 2012-12-18 DIAGNOSIS — E782 Mixed hyperlipidemia: Secondary | ICD-10-CM | POA: Insufficient documentation

## 2012-12-18 DIAGNOSIS — F17200 Nicotine dependence, unspecified, uncomplicated: Secondary | ICD-10-CM | POA: Insufficient documentation

## 2012-12-18 DIAGNOSIS — I2510 Atherosclerotic heart disease of native coronary artery without angina pectoris: Secondary | ICD-10-CM | POA: Insufficient documentation

## 2012-12-18 DIAGNOSIS — C61 Malignant neoplasm of prostate: Secondary | ICD-10-CM | POA: Insufficient documentation

## 2012-12-18 DIAGNOSIS — N1330 Unspecified hydronephrosis: Secondary | ICD-10-CM | POA: Insufficient documentation

## 2012-12-18 DIAGNOSIS — N269 Renal sclerosis, unspecified: Secondary | ICD-10-CM | POA: Insufficient documentation

## 2012-12-18 HISTORY — PX: CYSTOSCOPY WITH RETROGRADE PYELOGRAM, URETEROSCOPY AND STENT PLACEMENT: SHX5789

## 2012-12-18 LAB — BASIC METABOLIC PANEL
BUN: 31 mg/dL — ABNORMAL HIGH (ref 6–23)
CO2: 22 mEq/L (ref 19–32)
Calcium: 9.3 mg/dL (ref 8.4–10.5)
Chloride: 102 mEq/L (ref 96–112)
Creatinine, Ser: 2.87 mg/dL — ABNORMAL HIGH (ref 0.50–1.35)
GFR calc Af Amer: 25 mL/min — ABNORMAL LOW (ref >90)
GFR calc non Af Amer: 22 mL/min — ABNORMAL LOW (ref >90)
Glucose, Bld: 98 mg/dL (ref 70–99)
Potassium: 3.1 mEq/L — ABNORMAL LOW (ref 3.5–5.1)
Sodium: 138 mEq/L (ref 135–145)

## 2012-12-18 LAB — GLUCOSE, CAPILLARY
Glucose-Capillary: 98 mg/dL (ref 70–99)
Glucose-Capillary: 104 mg/dL — ABNORMAL HIGH (ref 70–99)

## 2012-12-18 LAB — CBC
HCT: 30.1 % — ABNORMAL LOW (ref 39.0–52.0)
Hemoglobin: 10.5 g/dL — ABNORMAL LOW (ref 13.0–17.0)
MCH: 30.2 pg (ref 26.0–34.0)
MCHC: 34.9 g/dL (ref 30.0–36.0)
MCV: 86.5 fL (ref 78.0–100.0)
Platelets: 312 10*3/uL (ref 150–400)
RBC: 3.48 MIL/uL — ABNORMAL LOW (ref 4.22–5.81)
RDW: 15 % (ref 11.5–15.5)
WBC: 6.9 10*3/uL (ref 4.0–10.5)

## 2012-12-18 SURGERY — CYSTOURETEROSCOPY, WITH RETROGRADE PYELOGRAM AND STENT INSERTION
Anesthesia: General | Site: Ureter | Laterality: Bilateral | Wound class: Clean Contaminated

## 2012-12-18 MED ORDER — FENTANYL CITRATE 0.05 MG/ML IJ SOLN
INTRAMUSCULAR | Status: AC | PRN
  Administered 2012-12-18: 100 ug via INTRAVENOUS

## 2012-12-18 MED ORDER — LIDOCAINE HCL 1 % IJ SOLN
INTRAMUSCULAR | Status: AC
  Filled 2012-12-18: qty 20

## 2012-12-18 MED ORDER — MIDAZOLAM HCL 2 MG/2ML IJ SOLN
INTRAMUSCULAR | Status: AC
  Filled 2012-12-18: qty 4

## 2012-12-18 MED ORDER — SODIUM CHLORIDE 0.9 % IR SOLN
Status: DC | PRN
  Administered 2012-12-18: 6000 mL

## 2012-12-18 MED ORDER — OXYCODONE-ACETAMINOPHEN 10-325 MG PO TABS: 1.0000 | ORAL_TABLET | ORAL | Status: DC | PRN

## 2012-12-18 MED ORDER — LIDOCAINE HCL (CARDIAC) 20 MG/ML IV SOLN
INTRAVENOUS | Status: DC | PRN
  Administered 2012-12-18: 50 mg via INTRAVENOUS

## 2012-12-18 MED ORDER — FENTANYL CITRATE 0.05 MG/ML IJ SOLN
INTRAMUSCULAR | Status: AC
  Filled 2012-12-18: qty 4

## 2012-12-18 MED ORDER — FENTANYL CITRATE 0.05 MG/ML IJ SOLN
INTRAMUSCULAR | Status: DC | PRN
  Administered 2012-12-18 (×2): 25 ug via INTRAVENOUS
  Administered 2012-12-18: 50 ug via INTRAVENOUS

## 2012-12-18 MED ORDER — PROPOFOL 10 MG/ML IV BOLUS
INTRAVENOUS | Status: DC | PRN
  Administered 2012-12-18: 150 mg via INTRAVENOUS

## 2012-12-18 MED ORDER — CIPROFLOXACIN IN D5W 200 MG/100ML IV SOLN
200.0000 mg | INTRAVENOUS | Status: DC
  Filled 2012-12-18: qty 100

## 2012-12-18 MED ORDER — PHENAZOPYRIDINE HCL 200 MG PO TABS: 200.0000 mg | ORAL_TABLET | Freq: Three times a day (TID) | ORAL | Status: DC | PRN

## 2012-12-18 MED ORDER — IOHEXOL 300 MG/ML  SOLN
25.0000 mL | Freq: Once | INTRAMUSCULAR | Status: AC | PRN
  Administered 2012-12-18: 15 mL

## 2012-12-18 MED ORDER — MIDAZOLAM HCL 2 MG/2ML IJ SOLN
INTRAMUSCULAR | Status: AC | PRN
  Administered 2012-12-18: 2 mg via INTRAVENOUS

## 2012-12-18 MED ORDER — LACTATED RINGERS IV SOLN
INTRAVENOUS | Status: DC
  Administered 2012-12-18: 1000 mL via INTRAVENOUS

## 2012-12-18 SURGICAL SUPPLY — 17 items
BAG URINE DRAINAGE (UROLOGICAL SUPPLIES) ×2 IMPLANT
BAG URO CATCHER STRL LF (DRAPE) ×2 IMPLANT
BASKET ZERO TIP NITINOL 2.4FR (BASKET) IMPLANT
BSKT STON RTRVL ZERO TP 2.4FR (BASKET)
CATH FOLEY 2WAY 5CC 20FR (CATHETERS) ×2 IMPLANT
CATH INTERMIT  6FR 70CM (CATHETERS) ×2 IMPLANT
DRAPE CAMERA CLOSED 9X96 (DRAPES) ×2 IMPLANT
GLOVE BIOGEL M 8.0 STRL (GLOVE) ×2 IMPLANT
GOWN PREVENTION PLUS XLARGE (GOWN DISPOSABLE) ×2 IMPLANT
GOWN STRL REIN XL XLG (GOWN DISPOSABLE) ×4 IMPLANT
GUIDEWIRE ANG ZIPWIRE 038X150 (WIRE) IMPLANT
GUIDEWIRE STR DUAL SENSOR (WIRE) ×4 IMPLANT
MANIFOLD NEPTUNE II (INSTRUMENTS) ×2 IMPLANT
PACK CYSTO (CUSTOM PROCEDURE TRAY) ×2 IMPLANT
STENT CONTOUR 6FRX24X.038 (STENTS) ×2 IMPLANT
STENT CONTOUR 7FRX24 (STENTS) IMPLANT
TUBING CONNECTING 10 (TUBING) ×2 IMPLANT

## 2012-12-18 NOTE — Preoperative (Signed)
Beta Blockers   Reason not to administer Beta Blockers:Not Applicable 

## 2012-12-18 NOTE — Anesthesia Postprocedure Evaluation (Signed)
Anesthesia Post Note  Patient: Francisco Dunn  Procedure(s) Performed: Procedure(s) (LRB): CYSTOSCOPY, RIGHT STENT EXCHANGE, (Bilateral)  Anesthesia type: General  Patient location: PACU  Post pain: Pain level controlled  Post assessment: Post-op Vital signs reviewed  Last Vitals:  Filed Vitals:   12/18/12 1307  BP: 136/104  Pulse: 66  Temp:   Resp: 10    Post vital signs: Reviewed  Level of consciousness: sedated  Complications: No apparent anesthesia complications

## 2012-12-18 NOTE — Anesthesia Preprocedure Evaluation (Signed)
Anesthesia Evaluation  Patient identified by MRN, date of birth, ID band Patient awake    Reviewed: Allergy & Precautions, H&P , NPO status , Patient's Chart, lab work & pertinent test results  Airway Mallampati: II TM Distance: >3 FB Neck ROM: full    Dental  (+) Caps and Dental Advisory Given Cap right upper front:   Pulmonary former smoker,  breath sounds clear to auscultation  Pulmonary exam normal       Cardiovascular Exercise Tolerance: Good hypertension, Pt. on home beta blockers and Pt. on medications + CAD and + Past MI Rhythm:regular Rate:Normal  03/2012 Cath showed occluded mid left circumflex and minor irregularities in the LAD and RCA.  EF 55%   Neuro/Psych Spinal mets for prostate CA negative psych ROS   GI/Hepatic negative GI ROS, Neg liver ROS,   Endo/Other  diabetes, Well Controlled, Type 2, Oral Hypoglycemic Agents  Renal/GU Renal diseasenegative Renal ROS  negative genitourinary   Musculoskeletal   Abdominal   Peds  Hematology negative hematology ROS (+)   Anesthesia Other Findings   Reproductive/Obstetrics negative OB ROS                           Anesthesia Physical Anesthesia Plan  ASA: III  Anesthesia Plan: General   Post-op Pain Management:    Induction: Intravenous  Airway Management Planned: LMA  Additional Equipment:   Intra-op Plan:   Post-operative Plan: Extubation in OR  Informed Consent: I have reviewed the patients History and Physical, chart, labs and discussed the procedure including the risks, benefits and alternatives for the proposed anesthesia with the patient or authorized representative who has indicated his/her understanding and acceptance.   Dental advisory given  Plan Discussed with: CRNA  Anesthesia Plan Comments:         Anesthesia Quick Evaluation

## 2012-12-18 NOTE — Procedures (Signed)
L PCN 10 fr No comp

## 2012-12-18 NOTE — Progress Notes (Signed)
Foley cath changed to leg bag. Patient and wife familiar w Dsg changes for nephrostomy tube

## 2012-12-18 NOTE — Transfer of Care (Signed)
Immediate Anesthesia Transfer of Care Note  Patient: Francisco Dunn  Procedure(s) Performed: Procedure(s): CYSTOSCOPY, RIGHT STENT EXCHANGE, (Bilateral)  Patient Location: PACU  Anesthesia Type:General  Level of Consciousness: awake, oriented, patient cooperative, lethargic and responds to stimulation  Airway & Oxygen Therapy: Patient Spontanous Breathing and Patient connected to face mask oxygen  Post-op Assessment: Report given to PACU RN, Post -op Vital signs reviewed and stable and Patient moving all extremities  Post vital signs: Reviewed and stable  Complications: No apparent anesthesia complications

## 2012-12-18 NOTE — Progress Notes (Signed)
Patient ID: Francisco Dunn, male   DOB: 18-Oct-1950, 62 y.o.   MRN: [id number] Request received for placement of a left percutaneous nephrostomy tube in pt with hx of metastatic prostate carcinoma, hydronephrosis, renal failure and inability to visualize left ureteral orifice today via cystoscopy. Pt is s/p right ureteral stent replacement today. Additional PMH as below. Exam: pt awake/alert; chest- CTA bilat; heart- RRR; abd- soft, mild diffuse tenderness,+BS; ext- mild edema.    Filed Vitals:   12/18/12 1130 12/18/12 1131 12/18/12 1145 12/18/12 1200  BP: 118/70  126/66 119/67  Pulse: 63 63 59 59  Temp: 97.8 F (36.6 C)  97.8 F (36.6 C) 97.8 F (36.6 C)  TempSrc:      Resp: 10 17 10 11   Height:      Weight:      SpO2: 100% 100% 100% 97%   Past Medical History  Diagnosis Date  . Diabetes type 2, controlled   . Mixed hyperlipidemia   . HTN (hypertension)   . 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 Renae Fickle (onc), added casodex, now on abiaterone+prednisone (Ottelin)  . Coronary artery disease 03/2012    Abnormal nuclear stress test with small ischemia in the inferior lateral wall. Cardiac catheterization showed occluded mid left circumflex with minor irregularities in the LAD and RCA. Ejection fraction was 55%. Attempted LCX PCI but was not able to cross the lesion with a wire likely due to chronic state  . Hydronephrosis, right     stent replaced Q5 mo   Past Surgical History  Procedure Laterality Date  . Tonsillectomy  1960's  . Wisdom tooth extraction  1968  . Carpal tunnel release  10/12/06    Right  . Hospitalization  09/2011    ARF with R hydro s/p perc nephrostomy, advanced prostate carcinoma with mets to lymph and spine  . Cystoscopy with stent placement  8/13    right, removal 02/2012 (Ottelin)  . Cystoscopy w/ ureteral stent placement  02/11/2012    Procedure: CYSTOSCOPY WITH  RETROGRADE PYELOGRAM/URETERAL STENT PLACEMENT;  Surgeon: Garnett Farm, MD;  Location: [facility];  Service: Urology;  Laterality: Right;  NO URETEROSCOPY   . Cystoscopy w/ ureteral stent removal  02/11/2012    Procedure: CYSTOSCOPY WITH STENT REMOVAL;  Surgeon: Garnett Farm, MD;  Location: [facility];  Service: Urology;  Laterality: Right;  . Cardiac catheterization  03/2012    [facility]  . Cystoscopy w/ ureteral stent placement Right 07/14/2012    Procedure: RIGHT RETROGRADE PYELOGRAM/STENT CHANGE;  Surgeon: Garnett Farm, MD;  Location: [facility];  Service: Urology;  Laterality: Right;   Results for orders placed during the hospital encounter of 12/18/12  CBC      Result Value Range   WBC 6.9  4.0 - 10.5 K/uL   RBC 3.48 (*) 4.22 - 5.81 MIL/uL   Hemoglobin 10.5 (*) 13.0 - 17.0 g/dL   HCT 40.9 (*) 81.1 - 91.4 %   MCV 86.5  78.0 - 100.0 fL   MCH 30.2  26.0 - 34.0 pg   MCHC 34.9  30.0 - 36.0 g/dL   RDW 78.2  95.6 - 21.3 %   Platelets 312  150 - 400 K/uL  BASIC METABOLIC PANEL      Result Value Range   Sodium 138  135 - 145 mEq/L   Potassium 3.1 (*) 3.5 -  5.1 mEq/L   Chloride 102  96 - 112 mEq/L   CO2 22  19 - 32 mEq/L   Glucose, Bld 98  70 - 99 mg/dL   BUN 31 (*) 6 - 23 mg/dL   Creatinine, Ser 1.61 (*) 0.50 - 1.35 mg/dL   Calcium 9.3  8.4 - 09.6 mg/dL   GFR calc non Af Amer 22 (*) >90 mL/min   GFR calc Af Amer 25 (*) >90 mL/min  GLUCOSE, CAPILLARY      Result Value Range   Glucose-Capillary 98  70 - 99 mg/dL   Comment 1 Documented in Chart     A/P: Pt with hx metastatic prostate carcinoma, left hydronephrosis, renal failure, and inability to locate left ureteral orifice via cystoscopy. Plan is for left percutaneous nephrostomy today and attempt at left ureteral stent placement at later date. Details/risks of procedure d/w pt/wife with their understanding and consent.

## 2012-12-18 NOTE — Op Note (Signed)
PATIENT:  Francisco Dunn  PRE-OPERATIVE DIAGNOSIS: 1. Malignant right ureteral obstruction with stent 2. New left hydronephrosis. 3. Elevated creatinine  POST-OPERATIVE DIAGNOSIS: Same  PROCEDURE: 1. Cystoscopy with right stent removal. 2. Right stent replacement.  SURGEON:  Garnett Farm  INDICATION: Francisco Dunn is a  62 year old male with high-grade metastatic adenocarcinoma of the prostate who was scheduled for a routine right double-J stent replacement but recently underwent a renal ultrasound that revealed bilateral hydronephrosis with a creatinine of 2.76 and a PSA of 29. I have discussed with the patient the fact that I will very likely be able to replace his right double-J stent but may have difficulty placing a left double-J stent if his prostate cancer has progressed to involve the trigone. He understands that this would necessitate the placement of a percutaneous nephrostomy tube which could potentially been be internalized.  ANESTHESIA:  General  EBL:  50 cc  DRAINS: 1. 6 French, 24 cm double-J stent in the right ureter.  2. 20 French Foley catheter  LOCAL MEDICATIONS USED:  None  SPECIMEN:  None  Description of procedure: After informed consent the patient was brought to the major or, placed on the table and administered general anesthesia. He was then moved to the dorsal lithotomy position and his genitalia sterilely prepped and draped. An official timeout was then performed.  The 22 French cystoscope was passed down the urethra under direct vision. The urethra was noted be normal. The prostatic urethra was elongated and somewhat lobular. As I entered the bladder I noted extensive soft tissue involving the bladder neck and trigone and it appeared to be more prominent on the left-hand side. It bled very easily. It was most consistent with invasion of the bladder and trigonal region with prostate cancer.  I searched for quite a while in an attempt to locate the left  ureteral orifice but was unsuccessful. I therefore grasped the right double-J stent with alligator forceps and withdrew the tip of the stent out through the urethral meatus. A 0.038 inch floppy-tipped guidewire was then passed through the stent and into the area of the renal pelvis under direct fluoroscopic control. I then attempted to pass a 7 [place] stent but was unable to advance the stent passed the level just inferior to the iliac vessels suggesting extrinsic compression in this location. I therefore switched to a 6 [place], 24 cm double-J stent and was able to pass this successfully into the area of the renal pelvis. As I removed the guidewire I noted good curl in the renal pelvis and good curl was also noted in the bladder cystoscopically.  I again attempted to locate the left ureteral orifice but there was too much cancer and distortion of the anatomy to find ureteral orifice. 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 Because the cancer was so friable there was bleeding from multiple locations at all appeared venous and I therefore placed a Foley catheter. The patient may be more comfortable maintain the Foley catheter over time at this point however I will let him make that decision and in the meantime will inform him that can be removed if he is less comfortable with the catheter in place. I do want to remain until the bleeding diminishes.  As far as his left sided obstruction goes I will arrange for a percutaneous nephrostomy tube to be placed and then potentially internalized.  PLAN OF CARE:  Discharge to home after PACU  PATIENT DISPOSITION:  PACU - hemodynamically stable.

## 2012-12-18 NOTE — Interval H&P Note (Signed)
History and Physical Interval Note:  12/18/2012 10:03 AM  Francisco Dunn  has presented today for surgery, with the diagnosis of BILATERAL HYDRONEPHROSIS  The various methods of treatment have been discussed with the patient and family. After consideration of risks, benefits and other options for treatment, the patient has consented to  Procedure(s): CYSTOSCOPY, RIGHT STENT EXCHANGE, BILATERAL  RETROGRADE PYELOGRAM/LEFT STENT (Bilateral) as a surgical intervention .  The patient's history has been reviewed, patient examined, no change in status, stable for surgery.  I have reviewed the patient's chart and labs.  Questions were answered to the patient's satisfaction.     Garnett Farm

## 2012-12-19 ENCOUNTER — Encounter (HOSPITAL_COMMUNITY): Payer: Self-pay | Admitting: Urology

## 2012-12-21 ENCOUNTER — Encounter: Payer: Self-pay | Admitting: Family Medicine

## 2012-12-26 LAB — CBC CANCER CENTER
Basophil #: 0.1 x10 3/mm (ref 0.0–0.1)
Basophil %: 0.7 %
Eosinophil #: 0.3 x10 3/mm (ref 0.0–0.7)
Eosinophil %: 2.6 %
HGB: 11.3 g/dL — ABNORMAL LOW (ref 13.0–18.0)
Lymphocyte #: 1.2 x10 3/mm (ref 1.0–3.6)
Lymphocyte %: 10.6 %
Monocyte #: 1.2 x10 3/mm — ABNORMAL HIGH (ref 0.2–1.0)
Neutrophil %: 75.4 %
Platelet: 441 x10 3/mm — ABNORMAL HIGH (ref 150–440)
RBC: 3.8 10*6/uL — ABNORMAL LOW (ref 4.40–5.90)
WBC: 11 x10 3/mm — ABNORMAL HIGH (ref 3.8–10.6)

## 2012-12-26 LAB — BASIC METABOLIC PANEL
BUN: 24 mg/dL — ABNORMAL HIGH (ref 7–18)
Calcium, Total: 8.9 mg/dL (ref 8.5–10.1)
Chloride: 99 mmol/L (ref 98–107)
Co2: 26 mmol/L (ref 21–32)
EGFR (African American): 29 — ABNORMAL LOW
EGFR (Non-African Amer.): 25 — ABNORMAL LOW
Glucose: 171 mg/dL — ABNORMAL HIGH (ref 65–99)
Osmolality: 282 (ref 275–301)
Sodium: 137 mmol/L (ref 136–145)

## 2012-12-27 ENCOUNTER — Other Ambulatory Visit (HOSPITAL_COMMUNITY): Payer: Self-pay | Admitting: Radiology

## 2012-12-29 ENCOUNTER — Other Ambulatory Visit (HOSPITAL_COMMUNITY): Payer: Self-pay | Admitting: Interventional Radiology

## 2012-12-29 DIAGNOSIS — N135 Crossing vessel and stricture of ureter without hydronephrosis: Secondary | ICD-10-CM

## 2013-01-01 ENCOUNTER — Ambulatory Visit: Payer: Self-pay | Admitting: Oncology

## 2013-01-02 ENCOUNTER — Encounter (HOSPITAL_COMMUNITY): Payer: Self-pay | Admitting: Pharmacy Technician

## 2013-01-03 ENCOUNTER — Inpatient Hospital Stay (HOSPITAL_COMMUNITY): Admit: 2013-01-03 | Payer: Managed Care, Other (non HMO)

## 2013-01-04 ENCOUNTER — Other Ambulatory Visit (HOSPITAL_COMMUNITY): Payer: Managed Care, Other (non HMO)

## 2013-01-04 LAB — CBC CANCER CENTER
Basophil #: 0.1 x10 3/mm (ref 0.0–0.1)
HCT: 31.5 % — ABNORMAL LOW (ref 40.0–52.0)
Lymphocyte %: 12.1 %
MCV: 87 fL (ref 80–100)
Monocyte #: 1.1 x10 3/mm — ABNORMAL HIGH (ref 0.2–1.0)
Neutrophil #: 11.3 x10 3/mm — ABNORMAL HIGH (ref 1.4–6.5)
Platelet: 366 x10 3/mm (ref 150–440)
RBC: 3.61 10*6/uL — ABNORMAL LOW (ref 4.40–5.90)
WBC: 14.4 x10 3/mm — ABNORMAL HIGH (ref 3.8–10.6)

## 2013-01-04 LAB — COMPREHENSIVE METABOLIC PANEL
Albumin: 2.6 g/dL — ABNORMAL LOW (ref 3.4–5.0)
Alkaline Phosphatase: 117 U/L
Anion Gap: 13 (ref 7–16)
BUN: 22 mg/dL — ABNORMAL HIGH (ref 7–18)
Bilirubin,Total: 0.3 mg/dL (ref 0.2–1.0)
Calcium, Total: 9 mg/dL (ref 8.5–10.1)
Chloride: 95 mmol/L — ABNORMAL LOW (ref 98–107)
Creatinine: 2.69 mg/dL — ABNORMAL HIGH (ref 0.60–1.30)
EGFR (African American): 28 — ABNORMAL LOW
EGFR (Non-African Amer.): 24 — ABNORMAL LOW
Glucose: 112 mg/dL — ABNORMAL HIGH (ref 65–99)
Osmolality: 272 (ref 275–301)
Potassium: 2.7 mmol/L — ABNORMAL LOW (ref 3.5–5.1)
SGOT(AST): 27 U/L (ref 15–37)
SGPT (ALT): 13 U/L (ref 12–78)

## 2013-01-05 ENCOUNTER — Encounter (HOSPITAL_COMMUNITY): Payer: Self-pay

## 2013-01-05 ENCOUNTER — Other Ambulatory Visit (HOSPITAL_COMMUNITY): Payer: Self-pay | Admitting: Interventional Radiology

## 2013-01-05 ENCOUNTER — Ambulatory Visit (HOSPITAL_COMMUNITY)
Admission: RE | Admit: 2013-01-05 | Discharge: 2013-01-05 | Disposition: A | Discharge status: Home / Self Care | Payer: Managed Care, Other (non HMO) | Source: Ambulatory Visit | Attending: Interventional Radiology | Admitting: Interventional Radiology

## 2013-01-05 ENCOUNTER — Inpatient Hospital Stay (HOSPITAL_COMMUNITY): Admission: RE | Admit: 2013-01-05 | Payer: Managed Care, Other (non HMO) | Source: Ambulatory Visit

## 2013-01-05 ENCOUNTER — Ambulatory Visit (HOSPITAL_COMMUNITY)
Admission: RE | Admit: 2013-01-05 | Discharge: 2013-01-05 | Disposition: A | Discharge status: Home / Self Care | Payer: Managed Care, Other (non HMO) | Source: Ambulatory Visit | Attending: Urology | Admitting: Urology

## 2013-01-05 DIAGNOSIS — N135 Crossing vessel and stricture of ureter without hydronephrosis: Secondary | ICD-10-CM

## 2013-01-05 DIAGNOSIS — C7951 Secondary malignant neoplasm of bone: Secondary | ICD-10-CM | POA: Insufficient documentation

## 2013-01-05 DIAGNOSIS — E876 Hypokalemia: Secondary | ICD-10-CM | POA: Insufficient documentation

## 2013-01-05 DIAGNOSIS — I2510 Atherosclerotic heart disease of native coronary artery without angina pectoris: Secondary | ICD-10-CM | POA: Insufficient documentation

## 2013-01-05 DIAGNOSIS — Z79899 Other long term (current) drug therapy: Secondary | ICD-10-CM | POA: Insufficient documentation

## 2013-01-05 DIAGNOSIS — C787 Secondary malignant neoplasm of liver and intrahepatic bile duct: Secondary | ICD-10-CM | POA: Insufficient documentation

## 2013-01-05 DIAGNOSIS — I10 Essential (primary) hypertension: Secondary | ICD-10-CM | POA: Insufficient documentation

## 2013-01-05 DIAGNOSIS — N1330 Unspecified hydronephrosis: Secondary | ICD-10-CM | POA: Insufficient documentation

## 2013-01-05 DIAGNOSIS — E119 Type 2 diabetes mellitus without complications: Secondary | ICD-10-CM | POA: Insufficient documentation

## 2013-01-05 DIAGNOSIS — C61 Malignant neoplasm of prostate: Secondary | ICD-10-CM | POA: Insufficient documentation

## 2013-01-05 DIAGNOSIS — E782 Mixed hyperlipidemia: Secondary | ICD-10-CM | POA: Insufficient documentation

## 2013-01-05 LAB — CBC
MCV: 85.7 fL (ref 78.0–100.0)
Platelets: 365 10*3/uL (ref 150–400)
RBC: 3.57 MIL/uL — ABNORMAL LOW (ref 4.22–5.81)
RDW: 14 % (ref 11.5–15.5)
WBC: 23.1 10*3/uL — ABNORMAL HIGH (ref 4.0–10.5)

## 2013-01-05 LAB — BASIC METABOLIC PANEL
CO2: 24 mEq/L (ref 19–32)
Calcium: 8.9 mg/dL (ref 8.4–10.5)
Chloride: 96 mEq/L (ref 96–112)
Creatinine, Ser: 2.4 mg/dL — ABNORMAL HIGH (ref 0.50–1.35)
Sodium: 134 mEq/L — ABNORMAL LOW (ref 135–145)

## 2013-01-05 LAB — PROTIME-INR: INR: 0.99 (ref 0.00–1.49)

## 2013-01-05 LAB — APTT: aPTT: 34 seconds (ref 24–37)

## 2013-01-05 MED ORDER — FENTANYL CITRATE 0.05 MG/ML IJ SOLN
INTRAMUSCULAR | Status: AC | PRN
  Administered 2013-01-05 (×2): 25 ug via INTRAVENOUS

## 2013-01-05 MED ORDER — IOHEXOL 300 MG/ML  SOLN
25.0000 mL | Freq: Once | INTRAMUSCULAR | Status: AC | PRN
  Administered 2013-01-05: 25 mL

## 2013-01-05 MED ORDER — FENTANYL CITRATE 0.05 MG/ML IJ SOLN
INTRAMUSCULAR | Status: AC
  Filled 2013-01-05: qty 6

## 2013-01-05 MED ORDER — POTASSIUM CHLORIDE CRYS ER 20 MEQ PO TBCR
60.0000 meq | EXTENDED_RELEASE_TABLET | Freq: Once | ORAL | Status: AC
  Administered 2013-01-05: 60 meq via ORAL
  Filled 2013-01-05: qty 3

## 2013-01-05 MED ORDER — ONDANSETRON HCL 4 MG/2ML IJ SOLN: 4.0000 mg | Freq: Four times a day (QID) | INTRAMUSCULAR | Status: DC | PRN

## 2013-01-05 MED ORDER — CIPROFLOXACIN IN D5W 400 MG/200ML IV SOLN: 400.0000 mg | Freq: Once | INTRAVENOUS | Status: DC

## 2013-01-05 MED ORDER — SODIUM CHLORIDE 0.9 % IV SOLN
Freq: Once | INTRAVENOUS | Status: AC
  Administered 2013-01-05: 08:00:00 via INTRAVENOUS

## 2013-01-05 MED ORDER — DEXTROSE 5 % IV SOLN
1.0000 g | Freq: Once | INTRAVENOUS | Status: AC
  Administered 2013-01-05: 1 g via INTRAVENOUS
  Filled 2013-01-05: qty 10

## 2013-01-05 MED ORDER — MIDAZOLAM HCL 2 MG/2ML IJ SOLN
INTRAMUSCULAR | Status: AC
  Filled 2013-01-05: qty 6

## 2013-01-05 MED ORDER — MIDAZOLAM HCL 2 MG/2ML IJ SOLN
INTRAMUSCULAR | Status: AC | PRN
  Administered 2013-01-05: 1 mg via INTRAVENOUS
  Administered 2013-01-05: 0.5 mg via INTRAVENOUS

## 2013-01-05 MED ORDER — LIDOCAINE HCL 1 % IJ SOLN
INTRAMUSCULAR | Status: AC
  Filled 2013-01-05: qty 20

## 2013-01-05 NOTE — Procedures (Signed)
Procedure:  Left nephrostogram and ureteral stent placement Findings:  Distal ureter narrowed, but open.  8 Fr x 24 cm ureteral stent placed extending from renal pelvis to bladder.  Good drainage.  Perc nephrostomy access removed.

## 2013-01-05 NOTE — Progress Notes (Signed)
Pt incontient, not able to void after procedure. Notified Dr Fredia Sorrow & received order ok to d/c home.

## 2013-01-05 NOTE — H&P (Signed)
HPI: Francisco Dunn is an 62 y.o. male with prostate cancer who developed obstructive uropathy. Dr. Phoebe Perch was able to get a right ureteral stent in and we were able to place a (L)PCN as his left ureter was obstructed and could not be found on cysto. He has been doing okay. He started some chemo last week, but otherwise feels ok. No issues with the PCN. He is now scheduled for attempt at internalizing to a ureteral stent on the left. PMHx and meds reviewed.  Past Medical History:  Past Medical History  Diagnosis Date  . Diabetes type 2, controlled   . Mixed hyperlipidemia   . HTN (hypertension)   . 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 Renae Fickle (onc), added casodex, now on abiaterone+prednisone (Ottelin)  . Coronary artery disease 03/2012    Abnormal nuclear stress test with small ischemia in the inferior lateral wall. Cardiac catheterization showed occluded mid left circumflex with minor irregularities in the LAD and RCA. Ejection fraction was 55%. Attempted LCX PCI but was not able to cross the lesion with a wire likely due to chronic state  . Hydronephrosis, right     stent replaced Q5 mo  . Urinary tract obstruction     bilateral ureteral obstruction by metastatic prostate cancer    Past Surgical History:  Past Surgical History  Procedure Laterality Date  . Tonsillectomy  1960's  . Wisdom tooth extraction  1968  . Carpal tunnel release  10/12/06    Right  . Hospitalization  09/2011    ARF with R hydro s/p perc nephrostomy, advanced prostate carcinoma with mets to lymph and spine  . Cystoscopy with stent placement  8/13    right, removal 02/2012 (Ottelin)  . Cystoscopy w/ ureteral stent placement  02/11/2012    Procedure: CYSTOSCOPY WITH RETROGRADE PYELOGRAM/URETERAL STENT PLACEMENT;  Surgeon: Garnett Farm, MD;  Location: [facility];  Service: Urology;  Laterality:  Right;  NO URETEROSCOPY   . Cystoscopy w/ ureteral stent removal  02/11/2012    Procedure: CYSTOSCOPY WITH STENT REMOVAL;  Surgeon: Garnett Farm, MD;  Location: [facility];  Service: Urology;  Laterality: Right;  . Cardiac catheterization  03/2012    [facility]  . Cystoscopy w/ ureteral stent placement Right 07/14/2012    Procedure: RIGHT RETROGRADE PYELOGRAM/STENT CHANGE;  Surgeon: Garnett Farm, MD;  Location: [facility];  Service: Urology;  Laterality: Right;  . Cystoscopy with retrograde pyelogram, ureteroscopy and stent placement Bilateral 12/18/2012    Procedure: CYSTOSCOPY, RIGHT STENT EXCHANGE,;  Surgeon: Garnett Farm, MD;  Location: [facility];  Service: Urology;  Laterality: Bilateral;    Family History:  Family History  Problem Relation Age of Onset  . Coronary artery disease Mother     PTCA  . Healthy Father   . Thyroid disease Brother   . Heart attack Maternal Grandfather   . Heart attack Paternal Grandfather   . Heart attack Paternal Grandmother   . Heart attack Paternal Aunt   . Diabetes      Both sides  . Prostate cancer Neg Hx   . Colon cancer Neg Hx   . Breast cancer Neg Hx     Social History:  reports that he has quit smoking. His smoking use included Cigarettes. He smoked 0.00 packs per day for 20 years. He has never used smokeless tobacco. He reports that  he does not drink alcohol or use illicit drugs.  Allergies:  Allergies  Allergen Reactions  . Contrast Media [Iodinated Diagnostic Agents] Nausea And Vomiting    Felt unwell 2 hours post dye with nausea and general feeling unwell  . Lisinopril Other (See Comments)    creatinine elevation 1.4->2.0    Medications:   Medication List    ASK your doctor about these medications       amLODipine 10 MG tablet  Commonly known as:  NORVASC  Take 10 mg by mouth daily with breakfast.     atorvastatin 20 MG tablet  Commonly known as:  LIPITOR  Take 1 tablet (20 mg total) by mouth  daily.     carvedilol 12.5 MG tablet  Commonly known as:  COREG  Take 1 tablet (12.5 mg total) by mouth 2 (two) times daily.     glimepiride 1 MG tablet  Commonly known as:  AMARYL  Take 1 mg by mouth daily before breakfast.     hydrochlorothiazide 25 MG tablet  Commonly known as:  HYDRODIURIL  Take 25 mg by mouth daily with breakfast.     multivitamin tablet  Take 1 tablet by mouth daily.     oxyCODONE-acetaminophen 10-325 MG per tablet  Commonly known as:  PERCOCET  Take 1 tablet by mouth every 4 (four) hours as needed for pain.     phenazopyridine 200 MG tablet  Commonly known as:  PYRIDIUM  Take 200 mg by mouth 3 (three) times daily as needed for pain.        Please HPI for pertinent positives, otherwise complete 10 system ROS negative.  Physical Exam: BP 100/59  Pulse 68  Temp(Src) 98 F (36.7 C) (Oral)  Resp 18  Ht 6' (1.829 m)  Wt 167 lb (75.751 kg)  BMI 22.64 kg/m2  SpO2 100% Body mass index is 22.64 kg/(m^2).   General Appearance:  Alert, cooperative, no distress, appears stated age  Head:  Normocephalic, without obvious abnormality, atraumatic  ENT: Unremarkable  Neck: Supple, symmetrical, trachea midline  Lungs:   Clear to auscultation bilaterally, no w/r/r, respirations unlabored without use of accessory muscles.  Chest Wall:  No tenderness or deformity  Heart:  Regular rate and rhythm, S1, S2 normal, no murmur, rub or gallop.  Abdomen:   Soft, non-tender, non distended. (L)PCN intact, site clean. UOP clear.  Neurologic: Normal affect, no gross deficits.   Results for orders placed during the hospital encounter of 01/05/13 (from the past 48 hour(s))  CBC     Status: Abnormal   Collection Time    01/05/13  7:55 AM      Result Value Range   WBC 23.1 (*) 4.0 - 10.5 K/uL   RBC 3.57 (*) 4.22 - 5.81 MIL/uL   Hemoglobin 10.8 (*) 13.0 - 17.0 g/dL   HCT 16.1 (*) 09.6 - 04.5 %   MCV 85.7  78.0 - 100.0 fL   MCH 30.3  26.0 - 34.0 pg   MCHC 35.3  30.0 -  36.0 g/dL   RDW 40.9  81.1 - 91.4 %   Platelets 365  150 - 400 K/uL  PROTIME-INR     Status: None   Collection Time    01/05/13  8:10 AM      Result Value Range   Prothrombin Time 12.9  11.6 - 15.2 seconds   INR 0.99  0.00 - 1.49  APTT     Status: None   Collection Time    01/05/13  8:10 AM      Result Value Range   aPTT 34  24 - 37 seconds   No results found.  Assessment/Plan Metastatic prostate cancer S/p L PCN due to distal ureteral obstriction. For attempt at internalizing to stent. Explained procedure, risks, complications, use of sedation. Explained possibility that we may not be able to cross obstruction and that PCN exchange may be only procedure. Also explained possibility that we may leave PCN as a 'safety valve' even if stent is placed. WBC elevated to 23k, likely from recent steroid dose during his chemo treatment. HypoK of uncertain etiology, 2.5. Will give po today and advise to f/u with PCP/Onc Consent signed in chart  Brayton El PA-C 01/05/2013, 8:42 AM

## 2013-01-05 NOTE — H&P (Signed)
Agree.  For nephrostogram with attempted antegrade left ureteral stent placement today.

## 2013-01-16 LAB — COMPREHENSIVE METABOLIC PANEL
Albumin: 2.4 g/dL — ABNORMAL LOW (ref 3.4–5.0)
Alkaline Phosphatase: 105 U/L
Chloride: 100 mmol/L (ref 98–107)
EGFR (African American): 32 — ABNORMAL LOW
EGFR (Non-African Amer.): 28 — ABNORMAL LOW
Glucose: 86 mg/dL (ref 65–99)
SGOT(AST): 12 U/L — ABNORMAL LOW (ref 15–37)
Total Protein: 6.5 g/dL (ref 6.4–8.2)

## 2013-01-16 LAB — CBC CANCER CENTER
Basophil #: 0 x10 3/mm (ref 0.0–0.1)
Eosinophil #: 0.2 x10 3/mm (ref 0.0–0.7)
HCT: 32.4 % — ABNORMAL LOW (ref 40.0–52.0)
HGB: 10.6 g/dL — ABNORMAL LOW (ref 13.0–18.0)
Lymphocyte #: 1.5 x10 3/mm (ref 1.0–3.6)
MCHC: 32.9 g/dL (ref 32.0–36.0)
Monocyte %: 12.1 %
Neutrophil #: 4 x10 3/mm (ref 1.4–6.5)
Platelet: 482 x10 3/mm — ABNORMAL HIGH (ref 150–440)
WBC: 6.5 x10 3/mm (ref 3.8–10.6)

## 2013-01-17 LAB — PSA: PSA: 31.8 ng/mL — ABNORMAL HIGH (ref 0.0–4.0)

## 2013-01-23 LAB — CBC CANCER CENTER
Basophil #: 0 x10 3/mm (ref 0.0–0.1)
Basophil %: 1.1 %
Eosinophil #: 0.1 x10 3/mm (ref 0.0–0.7)
HGB: 11 g/dL — ABNORMAL LOW (ref 13.0–18.0)
Lymphocyte %: 19.5 %
MCH: 29.9 pg (ref 26.0–34.0)
Monocyte #: 0.3 x10 3/mm (ref 0.2–1.0)
Monocyte %: 7.5 %
Neutrophil #: 2.7 x10 3/mm (ref 1.4–6.5)
Platelet: 320 x10 3/mm (ref 150–440)
RDW: 14.7 % — ABNORMAL HIGH (ref 11.5–14.5)
WBC: 4 x10 3/mm (ref 3.8–10.6)

## 2013-01-23 LAB — COMPREHENSIVE METABOLIC PANEL
Albumin: 2.6 g/dL — ABNORMAL LOW (ref 3.4–5.0)
Alkaline Phosphatase: 131 U/L — ABNORMAL HIGH
BUN: 21 mg/dL — ABNORMAL HIGH (ref 7–18)
Chloride: 101 mmol/L (ref 98–107)
Co2: 28 mmol/L (ref 21–32)
EGFR (African American): 34 — ABNORMAL LOW
EGFR (Non-African Amer.): 29 — ABNORMAL LOW
Osmolality: 283 (ref 275–301)
SGOT(AST): 18 U/L (ref 15–37)
SGPT (ALT): 15 U/L (ref 12–78)
Sodium: 140 mmol/L (ref 136–145)
Total Protein: 6.5 g/dL (ref 6.4–8.2)

## 2013-01-23 LAB — MAGNESIUM: Magnesium: 1.8 mg/dL

## 2013-01-30 LAB — CBC CANCER CENTER
Basophil #: 0.2 x10 3/mm — ABNORMAL HIGH (ref 0.0–0.1)
Basophil %: 1.4 %
Eosinophil %: 0.5 %
HCT: 36 % — ABNORMAL LOW (ref 40.0–52.0)
HGB: 11.7 g/dL — ABNORMAL LOW (ref 13.0–18.0)
Lymphocyte %: 13.4 %
Monocyte #: 0.9 x10 3/mm (ref 0.2–1.0)
Monocyte %: 6.4 %
Neutrophil #: 11.2 x10 3/mm — ABNORMAL HIGH (ref 1.4–6.5)
Neutrophil %: 78.3 %
Platelet: 309 x10 3/mm (ref 150–440)

## 2013-01-31 LAB — PSA: PSA: 36.4 ng/mL — ABNORMAL HIGH (ref 0.0–4.0)

## 2013-02-01 ENCOUNTER — Ambulatory Visit: Payer: Self-pay | Admitting: Oncology

## 2013-02-06 LAB — CBC CANCER CENTER
BASOS PCT: 0.5 %
Basophil #: 0.1 x10 3/mm (ref 0.0–0.1)
Eosinophil #: 0 x10 3/mm (ref 0.0–0.7)
Eosinophil %: 0.2 %
HCT: 31.8 % — ABNORMAL LOW (ref 40.0–52.0)
HGB: 10.3 g/dL — AB (ref 13.0–18.0)
Lymphocyte #: 1.4 x10 3/mm (ref 1.0–3.6)
Lymphocyte %: 12 %
MCH: 30.3 pg (ref 26.0–34.0)
MCHC: 32.4 g/dL (ref 32.0–36.0)
MCV: 94 fL (ref 80–100)
Monocyte #: 1.1 x10 3/mm — ABNORMAL HIGH (ref 0.2–1.0)
Monocyte %: 9.3 %
NEUTROS ABS: 8.8 x10 3/mm — AB (ref 1.4–6.5)
Neutrophil %: 78 %
PLATELETS: 291 x10 3/mm (ref 150–440)
RBC: 3.4 10*6/uL — ABNORMAL LOW (ref 4.40–5.90)
RDW: 17.2 % — ABNORMAL HIGH (ref 11.5–14.5)
WBC: 11.3 x10 3/mm — ABNORMAL HIGH (ref 3.8–10.6)

## 2013-02-06 LAB — COMPREHENSIVE METABOLIC PANEL
ALBUMIN: 2.4 g/dL — AB (ref 3.4–5.0)
ANION GAP: 10 (ref 7–16)
AST: 12 U/L — AB (ref 15–37)
Alkaline Phosphatase: 132 U/L — ABNORMAL HIGH
BUN: 23 mg/dL — ABNORMAL HIGH (ref 7–18)
Bilirubin,Total: 0.1 mg/dL — ABNORMAL LOW (ref 0.2–1.0)
CHLORIDE: 105 mmol/L (ref 98–107)
CO2: 25 mmol/L (ref 21–32)
Calcium, Total: 9.1 mg/dL (ref 8.5–10.1)
Creatinine: 2.37 mg/dL — ABNORMAL HIGH (ref 0.60–1.30)
EGFR (African American): 33 — ABNORMAL LOW
EGFR (Non-African Amer.): 28 — ABNORMAL LOW
Glucose: 122 mg/dL — ABNORMAL HIGH (ref 65–99)
OSMOLALITY: 284 (ref 275–301)
POTASSIUM: 3.7 mmol/L (ref 3.5–5.1)
SGPT (ALT): 7 U/L — ABNORMAL LOW (ref 12–78)
SODIUM: 140 mmol/L (ref 136–145)
TOTAL PROTEIN: 6.2 g/dL — AB (ref 6.4–8.2)

## 2013-02-06 LAB — MAGNESIUM: MAGNESIUM: 2.1 mg/dL

## 2013-02-12 ENCOUNTER — Ambulatory Visit (INDEPENDENT_AMBULATORY_CARE_PROVIDER_SITE_OTHER): Payer: Managed Care, Other (non HMO) | Admitting: Family Medicine

## 2013-02-12 ENCOUNTER — Encounter: Payer: Self-pay | Admitting: Family Medicine

## 2013-02-12 VITALS — BP 130/84 | HR 88 | Temp 97.6°F | Wt 162.8 lb

## 2013-02-12 DIAGNOSIS — N289 Disorder of kidney and ureter, unspecified: Secondary | ICD-10-CM

## 2013-02-12 DIAGNOSIS — E119 Type 2 diabetes mellitus without complications: Secondary | ICD-10-CM

## 2013-02-12 DIAGNOSIS — C61 Malignant neoplasm of prostate: Secondary | ICD-10-CM

## 2013-02-12 DIAGNOSIS — I10 Essential (primary) hypertension: Secondary | ICD-10-CM

## 2013-02-12 DIAGNOSIS — E782 Mixed hyperlipidemia: Secondary | ICD-10-CM

## 2013-02-12 LAB — POCT CBG (FASTING - GLUCOSE)-MANUAL ENTRY: Glucose Fasting, POC: 106 mg/dL — AB (ref 70–99)

## 2013-02-12 NOTE — Assessment & Plan Note (Signed)
Lab Results  Component Value Date   HGBA1C 6.7* 10/09/2012  improved off prednisone With weight loss and lack of appetite, anticipate ok to discontinue sulfonylurea. Check cbg today.

## 2013-02-12 NOTE — Progress Notes (Signed)
Pre-visit discussion using our clinic review tool. No additional management support is needed unless otherwise documented below in the visit note.  

## 2013-02-12 NOTE — Assessment & Plan Note (Signed)
Off atorvastatin per onc. Check FLP next blood work - script provided to take with him to lab tomorrow.

## 2013-02-12 NOTE — Progress Notes (Signed)
   Subjective:    Patient ID: Francisco Dunn, male    DOB: Feb 09, 1950, 63 y.o.   MRN: [id number]  HPI CC: 6 mo f/u  Metastatic prostate cancer - now on chemotherapy.  s/p L percutaneous nephrostomy due to distal ureteral obstruction with internalization of stent by IR 12/5. Now has bilateral stents.  At that time found to have Cr 2.3 and K 2.5.  States had blood work done last week as he gets routinely with chemo - potassium remains low and he is being repleted during chemo treatments.  BP was very low so amlodipine was discontinued by oncology.  Atorvastatin was also discontinued.  Regularly sees Dr. Dione Plover onc at [facility]. Today notices feet have swollen and stay sensitive.  Also endorses blistering of hands after chemotherapy sessions. Thinks this is due to the chemotherapy.  DM - sugars improved once off prednisone.  No appetite, weight loss after chemo.  Forces himself to eat.   Lab Results  Component Value Date   HGBA1C 6.7* 10/09/2012   Wt Readings from Last 3 Encounters:  02/12/13 162 lb 12 oz (73.823 kg)  01/05/13 167 lb (75.751 kg)  12/18/12 180 lb (81.647 kg)   Declines flu shot today. Quit smoking a few months ago  Past Medical History  Diagnosis Date  . Diabetes type 2, controlled   . Mixed hyperlipidemia   . HTN (hypertension)   . 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 Len Blalock (onc), added casodex, now on abiaterone+prednisone (Ottelin)  . Coronary artery disease 03/2012    Abnormal nuclear stress test with small ischemia in the inferior lateral wall. Cardiac catheterization showed occluded mid left circumflex with minor irregularities in the LAD and RCA. Ejection fraction was 55%. Attempted LCX PCI but was not able to cross the lesion with a wire likely due to chronic state  . Hydronephrosis, right     stent replaced Q5 mo  . Urinary tract obstruction     bilateral ureteral  obstruction by metastatic prostate cancer    Review of Systems Per HPI    Objective:   Physical Exam  Nursing note and vitals reviewed. Constitutional: He appears well-developed and well-nourished. No distress.  HENT:  Mouth/Throat: Oropharynx is clear and moist. No oropharyngeal exudate.  Cardiovascular: Normal rate, regular rhythm, normal heart sounds and intact distal pulses.   No murmur heard. Pulmonary/Chest: Effort normal and breath sounds normal. No respiratory distress. He has no wheezes. He has no rales.  Musculoskeletal: He exhibits no edema.  Peeling of bilateral hands and feet  Skin: Skin is warm and dry.       Assessment & Plan:

## 2013-02-12 NOTE — Assessment & Plan Note (Signed)
Anticipate stable with stents.  I have asked him to send me upcoming blood work results (scheduled for tomorrow at [facility]).

## 2013-02-12 NOTE — Addendum Note (Signed)
Addended by: Royann Shivers A on: 02/12/2013 09:45 AM   Modules accepted: Orders

## 2013-02-12 NOTE — Assessment & Plan Note (Signed)
Stable on coreg and hctz, off amlodipine.

## 2013-02-12 NOTE — Patient Instructions (Addendum)
Let's stop amaryl (glimeperide) for now.  Sugar check today with fingerstick glucose. I'd like to obtain cholesterol panel with your next blood work - and have them fax me results. Good to see you today, call us with questions. Return in 3 months for follow up Regularly moisturize hands.

## 2013-02-12 NOTE — Assessment & Plan Note (Signed)
Continues chemo at [facility].

## 2013-02-13 ENCOUNTER — Telehealth: Payer: Self-pay

## 2013-02-13 ENCOUNTER — Telehealth: Payer: Self-pay | Admitting: Family Medicine

## 2013-02-13 LAB — CBC CANCER CENTER
BANDS NEUTROPHIL: 14 %
EOS PCT: 4 %
HCT: 30.3 % — AB (ref 40.0–52.0)
HGB: 10 g/dL — ABNORMAL LOW (ref 13.0–18.0)
LYMPHS PCT: 16 %
MCH: 30.8 pg (ref 26.0–34.0)
MCHC: 32.9 g/dL (ref 32.0–36.0)
MCV: 94 fL (ref 80–100)
MONOS PCT: 5 %
PLATELETS: 292 x10 3/mm (ref 150–440)
RBC: 3.23 10*6/uL — ABNORMAL LOW (ref 4.40–5.90)
RDW: 17.4 % — ABNORMAL HIGH (ref 11.5–14.5)
Segmented Neutrophils: 59 %
VARIANT LYMPHOCYTE - H4-RLYMPH: 2 %
WBC: 7.3 x10 3/mm (ref 3.8–10.6)

## 2013-02-13 NOTE — Telephone Encounter (Signed)
Relevant patient education assigned to patient using Emmi. ° °

## 2013-02-15 ENCOUNTER — Ambulatory Visit (INDEPENDENT_AMBULATORY_CARE_PROVIDER_SITE_OTHER): Payer: Managed Care, Other (non HMO) | Admitting: Cardiovascular Disease

## 2013-02-15 ENCOUNTER — Encounter: Payer: Self-pay | Admitting: Cardiovascular Disease

## 2013-02-15 VITALS — BP 115/68 | HR 67 | Ht 72.0 in | Wt 168.0 lb

## 2013-02-15 DIAGNOSIS — I10 Essential (primary) hypertension: Secondary | ICD-10-CM

## 2013-02-15 DIAGNOSIS — I2510 Atherosclerotic heart disease of native coronary artery without angina pectoris: Secondary | ICD-10-CM

## 2013-02-15 DIAGNOSIS — E782 Mixed hyperlipidemia: Secondary | ICD-10-CM

## 2013-02-15 NOTE — Progress Notes (Signed)
Primary care physician: Dr. Danise Mina  HPI  [name] is a very pleasant 63 year old gentleman who is here today for followup visit regarding coronary artery disease. He has a history of metastatic prostate cancer, hypertension and hyperlipidemia. He was seen in 03/2012 for substernal chest tightness. He was noted to have an abnormal EKG with inferior Q waves. He underwent a nuclear stress test which showed small area of ischemia in the inferolateral wall with borderline reduced LV systolic function. He underwent cardiac catheterization  which showed minor irregularities in the LAD and RCA. The left circumflex was occluded in the midsegment.  I attempted PCI on the left circumflex but was not able to cross the lesion likely due to a chronic state. I was able to see the distal vessel after creating a small channel. The distal vessel was overall small to medium in size. Due to that, medical therapy is recommended. He has been treated medically and reports no recurrent chest pain.  He has metastatic prostate cancer and still undergoing chemotherapy. He has lost significant amount of weight. His blood pressure gradually decreased which required stopping amlodipine. He was also taken off atorvastatin by Dr. Oliva Bustard. He is off his diabetes medications as well.    Allergies  Allergen Reactions  . Contrast Media [Iodinated Diagnostic Agents] Nausea And Vomiting    Felt unwell 2 hours post dye with nausea and general feeling unwell  . Lisinopril Other (See Comments)    creatinine elevation 1.4->2.0     Current Outpatient Prescriptions on File Prior to Visit  Medication Sig Dispense Refill  . carvedilol (COREG) 12.5 MG tablet Take 1 tablet (12.5 mg total) by mouth 2 (two) times daily.  60 tablet  6  . docetaxel (TAXOTERE) 20 MG/0.5ML injection Inject into the vein every 21 ( twenty-one) days.      . hydrochlorothiazide (HYDRODIURIL) 25 MG tablet Take 25 mg by mouth daily with breakfast.      .  Multiple Vitamin (MULTIVITAMIN) tablet Take 1 tablet by mouth daily.        No current facility-administered medications on file prior to visit.     Past Medical History  Diagnosis Date  . Diabetes type 2, controlled   . Mixed hyperlipidemia   . HTN (hypertension)   . 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 Len Blalock (onc), added casodex, now on abiaterone+prednisone (Ottelin)  . Coronary artery disease 03/2012    Abnormal nuclear stress test with small ischemia in the inferior lateral wall. Cardiac catheterization showed occluded mid left circumflex with minor irregularities in the LAD and RCA. Ejection fraction was 55%. Attempted LCX PCI but was not able to cross the lesion with a wire likely due to chronic state  . Hydronephrosis, right     stent replaced Q5 mo  . Urinary tract obstruction     bilateral ureteral obstruction by metastatic prostate cancer     Past Surgical History  Procedure Laterality Date  . Tonsillectomy  1960's  . Wisdom tooth extraction  1968  . Carpal tunnel release  10/12/06    Right  . Hospitalization  09/2011    ARF with R hydro s/p perc nephrostomy, advanced prostate carcinoma with mets to lymph and spine  . Cystoscopy with stent placement  8/13    right, removal 02/2012 (Ottelin)  . Cystoscopy w/ ureteral stent placement  02/11/2012    Procedure: CYSTOSCOPY WITH RETROGRADE  PYELOGRAM/URETERAL STENT PLACEMENT;  Surgeon: Claybon Jabs, MD;  Location: [facility];  Service: Urology;  Laterality: Right;  NO URETEROSCOPY   . Cystoscopy w/ ureteral stent removal  02/11/2012    Procedure: CYSTOSCOPY WITH STENT REMOVAL;  Surgeon: Claybon Jabs, MD;  Location: [facility];  Service: Urology;  Laterality: Right;  . Cardiac catheterization  03/2012    [facility]  . Cystoscopy w/ ureteral stent placement Right 07/14/2012    Procedure: RIGHT  RETROGRADE PYELOGRAM/STENT CHANGE;  Surgeon: Claybon Jabs, MD;  Location: [facility];  Service: Urology;  Laterality: Right;  . Cystoscopy with retrograde pyelogram, ureteroscopy and stent placement Bilateral 12/18/2012    Procedure: CYSTOSCOPY, RIGHT STENT EXCHANGE,;  Surgeon: Claybon Jabs, MD;  Location: [facility];  Service: Urology;  Laterality: Bilateral;     Family History  Problem Relation Age of Onset  . Coronary artery disease Mother     PTCA  . Healthy Father   . Thyroid disease Brother   . Heart attack Maternal Grandfather   . Heart attack Paternal Grandfather   . Heart attack Paternal Grandmother   . Heart attack Paternal Aunt   . Diabetes      Both sides  . Prostate cancer Neg Hx   . Colon cancer Neg Hx   . Breast cancer Neg Hx      History   Social History  . Marital Status: Married    Spouse Name: N/A    Number of Children: 44  . Years of Education: N/A   Occupational History  . Not on file.   Social History Main Topics  . Smoking status: Former Smoker -- 20 years    Types: Cigarettes  . Smokeless tobacco: Never Used  . Alcohol Use: No  . Drug Use: No  . Sexual Activity: Not on file   Other Topics Concern  . Not on file   Social History Narrative   Married   5 children--none in home   [place] 1 hour/2 x weekly   Skid wrapping/banding--by machine     PHYSICAL EXAM   BP 115/68  Ht 6' (1.829 m)  Wt 168 lb (76.204 kg)  BMI 22.78 kg/m2 Constitutional: He is oriented to person, place, and time. He appears well-developed and well-nourished. No distress.  HENT: No nasal discharge.  Head: Normocephalic and atraumatic.  Eyes: Pupils are equal and round. Right eye exhibits no discharge. Left eye exhibits no discharge.  Neck: Normal range of motion. Neck supple. No JVD present. No thyromegaly present.  Cardiovascular: Normal rate, regular rhythm, normal heart sounds and. Exam reveals no gallop and no friction rub. No murmur heard.    Pulmonary/Chest: Effort normal and breath sounds normal. No stridor. No respiratory distress. He has no wheezes. He has no rales. He exhibits no tenderness.  Abdominal: Soft. Bowel sounds are normal. He exhibits no distension. There is no tenderness. There is no rebound and no guarding.  Musculoskeletal: Normal range of motion. He exhibits no edema and no tenderness.  Neurological: He is alert and oriented to person, place, and time. Coordination normal.  Skin: Skin is warm and dry. No rash noted. He is not diaphoretic. No erythema. No pallor.  Psychiatric: He has a normal mood and affect. His behavior is normal. Judgment and thought content normal.      EKG: Sinus  Rhythm  Low voltage in precordial leads.   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   -  Negative precordial T-waves.   ABNORMAL   ASSESSMENT AND PLAN

## 2013-02-15 NOTE — Patient Instructions (Signed)
Continue same medications.   Your physician wants you to follow-up in: 6 months.  You will receive a reminder letter in the mail two months in advance. If you don't receive a letter, please call our office to schedule the follow-up appointment.  

## 2013-02-19 NOTE — Assessment & Plan Note (Signed)
He is stable overall from a cardiac standpoint with no symptoms suggestive of angina. I recommend continuing medical therapy.

## 2013-02-19 NOTE — Assessment & Plan Note (Signed)
Lab Results  Component Value Date   CHOL 236* 03/07/2012   HDL 33.40* 03/07/2012   LDLCALC 119* 10/08/2008   LDLDIRECT 131.0 03/07/2012   TRIG 180.0* 03/07/2012   CHOLHDL 7 03/07/2012   I agree with holding atorvastatin while he is on chemotherapy. I will consider resuming this in the future depending on his response to cancer treatment.

## 2013-02-19 NOTE — Assessment & Plan Note (Signed)
Blood pressure is well controlled on the current medications. His blood pressure improved after he lost significant amount of weight.

## 2013-02-20 LAB — CBC CANCER CENTER
Basophil #: 0.1 x10 3/mm (ref 0.0–0.1)
Basophil %: 0.3 %
Eosinophil #: 0 x10 3/mm (ref 0.0–0.7)
Eosinophil %: 0.2 %
HCT: 34.4 % — AB (ref 40.0–52.0)
HGB: 11.3 g/dL — AB (ref 13.0–18.0)
Lymphocyte #: 5.7 x10 3/mm — ABNORMAL HIGH (ref 1.0–3.6)
Lymphocyte %: 30.6 %
MCH: 31.3 pg (ref 26.0–34.0)
MCHC: 32.8 g/dL (ref 32.0–36.0)
MCV: 95 fL (ref 80–100)
Monocyte #: 0.9 x10 3/mm (ref 0.2–1.0)
Monocyte %: 4.7 %
NEUTROS ABS: 11.8 x10 3/mm — AB (ref 1.4–6.5)
NEUTROS PCT: 64.2 %
Platelet: 343 x10 3/mm (ref 150–440)
RBC: 3.61 10*6/uL — ABNORMAL LOW (ref 4.40–5.90)
RDW: 18.9 % — ABNORMAL HIGH (ref 11.5–14.5)
WBC: 18.5 x10 3/mm — AB (ref 3.8–10.6)

## 2013-02-20 LAB — LIPID PANEL
Cholesterol: 257 mg/dL — AB (ref 0–200)
Cholesterol: 257 mg/dL — ABNORMAL HIGH (ref 0–200)
HDL Cholesterol: 31 mg/dL — ABNORMAL LOW (ref 40–60)
HDL: 31 mg/dL — AB (ref 35–70)
TRIGLYCERIDES: 549 mg/dL — AB (ref 0–200)
Triglycerides: 549

## 2013-02-20 LAB — CBC
HGB: 11.3 g/dL
WBC: 18.5
platelet count: 343

## 2013-02-22 LAB — PSA: PSA: 25.9 ng/mL — AB (ref 0.0–4.0)

## 2013-02-26 ENCOUNTER — Ambulatory Visit: Payer: Self-pay | Admitting: Oncology

## 2013-02-26 ENCOUNTER — Encounter: Payer: Self-pay | Admitting: Family Medicine

## 2013-02-27 LAB — CBC CANCER CENTER
BASOS PCT: 0.9 %
Basophil #: 0.1 x10 3/mm (ref 0.0–0.1)
Eosinophil #: 0 x10 3/mm (ref 0.0–0.7)
Eosinophil %: 0.4 %
HCT: 35.9 % — ABNORMAL LOW (ref 40.0–52.0)
HGB: 11.6 g/dL — ABNORMAL LOW (ref 13.0–18.0)
LYMPHS PCT: 13.4 %
Lymphocyte #: 1 x10 3/mm (ref 1.0–3.6)
MCH: 30.9 pg (ref 26.0–34.0)
MCHC: 32.2 g/dL (ref 32.0–36.0)
MCV: 96 fL (ref 80–100)
MONO ABS: 1 x10 3/mm (ref 0.2–1.0)
MONOS PCT: 12.6 %
NEUTROS PCT: 72.7 %
Neutrophil #: 5.5 x10 3/mm (ref 1.4–6.5)
PLATELETS: 300 x10 3/mm (ref 150–440)
RBC: 3.74 10*6/uL — AB (ref 4.40–5.90)
RDW: 18.6 % — ABNORMAL HIGH (ref 11.5–14.5)
WBC: 7.6 x10 3/mm (ref 3.8–10.6)

## 2013-02-27 LAB — COMPREHENSIVE METABOLIC PANEL
ALBUMIN: 2.7 g/dL — AB (ref 3.4–5.0)
ALT: 9 U/L — AB (ref 12–78)
Alkaline Phosphatase: 89 U/L
Anion Gap: 5 — ABNORMAL LOW (ref 7–16)
BUN: 24 mg/dL — ABNORMAL HIGH (ref 7–18)
Bilirubin,Total: 0.1 mg/dL — ABNORMAL LOW (ref 0.2–1.0)
CALCIUM: 8.3 mg/dL — AB (ref 8.5–10.1)
CREATININE: 1.85 mg/dL — AB (ref 0.60–1.30)
Chloride: 106 mmol/L (ref 98–107)
Co2: 30 mmol/L (ref 21–32)
EGFR (Non-African Amer.): 38 — ABNORMAL LOW
GFR CALC AF AMER: 44 — AB
Glucose: 106 mg/dL — ABNORMAL HIGH (ref 65–99)
Osmolality: 286 (ref 275–301)
POTASSIUM: 4 mmol/L (ref 3.5–5.1)
SGOT(AST): 11 U/L — ABNORMAL LOW (ref 15–37)
Sodium: 141 mmol/L (ref 136–145)
Total Protein: 6.2 g/dL — ABNORMAL LOW (ref 6.4–8.2)

## 2013-03-04 ENCOUNTER — Ambulatory Visit: Payer: Self-pay | Admitting: Oncology

## 2013-03-06 LAB — CBC CANCER CENTER
BASOS PCT: 0.6 %
Basophil #: 0 x10 3/mm (ref 0.0–0.1)
Eosinophil #: 0.1 x10 3/mm (ref 0.0–0.7)
Eosinophil %: 1.8 %
HCT: 35.6 % — AB (ref 40.0–52.0)
HGB: 11.6 g/dL — ABNORMAL LOW (ref 13.0–18.0)
LYMPHS ABS: 0.9 x10 3/mm — AB (ref 1.0–3.6)
LYMPHS PCT: 17.3 %
MCH: 31.3 pg (ref 26.0–34.0)
MCHC: 32.6 g/dL (ref 32.0–36.0)
MCV: 96 fL (ref 80–100)
MONO ABS: 0.6 x10 3/mm (ref 0.2–1.0)
Monocyte %: 10.1 %
NEUTROS PCT: 70.2 %
Neutrophil #: 3.9 x10 3/mm (ref 1.4–6.5)
Platelet: 263 x10 3/mm (ref 150–440)
RBC: 3.71 10*6/uL — AB (ref 4.40–5.90)
RDW: 17.8 % — ABNORMAL HIGH (ref 11.5–14.5)
WBC: 5.5 x10 3/mm (ref 3.8–10.6)

## 2013-03-07 ENCOUNTER — Encounter: Payer: Self-pay | Admitting: *Deleted

## 2013-03-08 ENCOUNTER — Encounter: Payer: Self-pay | Admitting: *Deleted

## 2013-03-13 LAB — CBC CANCER CENTER
BASOS ABS: 0.1 x10 3/mm (ref 0.0–0.1)
Basophil %: 0.5 %
EOS ABS: 0.1 x10 3/mm (ref 0.0–0.7)
EOS PCT: 0.8 %
HCT: 39.1 % — ABNORMAL LOW (ref 40.0–52.0)
HGB: 12.6 g/dL — AB (ref 13.0–18.0)
Lymphocyte #: 1.5 x10 3/mm (ref 1.0–3.6)
Lymphocyte %: 11.2 %
MCH: 30.7 pg (ref 26.0–34.0)
MCHC: 32.1 g/dL (ref 32.0–36.0)
MCV: 96 fL (ref 80–100)
MONOS PCT: 6.6 %
Monocyte #: 0.9 x10 3/mm (ref 0.2–1.0)
Neutrophil #: 10.6 x10 3/mm — ABNORMAL HIGH (ref 1.4–6.5)
Neutrophil %: 80.9 %
PLATELETS: 300 x10 3/mm (ref 150–440)
RBC: 4.1 10*6/uL — ABNORMAL LOW (ref 4.40–5.90)
RDW: 17.7 % — ABNORMAL HIGH (ref 11.5–14.5)
WBC: 13.1 x10 3/mm — AB (ref 3.8–10.6)

## 2013-03-14 LAB — PSA: PSA: 24.3 ng/mL — AB (ref 0.0–4.0)

## 2013-03-21 LAB — COMPREHENSIVE METABOLIC PANEL
ALBUMIN: 2.9 g/dL — AB (ref 3.4–5.0)
ALK PHOS: 78 U/L
AST: 11 U/L — AB (ref 15–37)
Anion Gap: 8 (ref 7–16)
BUN: 29 mg/dL — ABNORMAL HIGH (ref 7–18)
Bilirubin,Total: 0.1 mg/dL — ABNORMAL LOW (ref 0.2–1.0)
CREATININE: 1.86 mg/dL — AB (ref 0.60–1.30)
Calcium, Total: 8.1 mg/dL — ABNORMAL LOW (ref 8.5–10.1)
Chloride: 105 mmol/L (ref 98–107)
Co2: 29 mmol/L (ref 21–32)
EGFR (African American): 44 — ABNORMAL LOW
EGFR (Non-African Amer.): 38 — ABNORMAL LOW
GLUCOSE: 82 mg/dL (ref 65–99)
OSMOLALITY: 288 (ref 275–301)
Potassium: 4.7 mmol/L (ref 3.5–5.1)
SGPT (ALT): 9 U/L — ABNORMAL LOW (ref 12–78)
Sodium: 142 mmol/L (ref 136–145)
Total Protein: 6.3 g/dL — ABNORMAL LOW (ref 6.4–8.2)

## 2013-03-21 LAB — CBC CANCER CENTER
BASOS ABS: 0.1 x10 3/mm (ref 0.0–0.1)
BASOS PCT: 1.1 %
EOS ABS: 0.2 x10 3/mm (ref 0.0–0.7)
EOS PCT: 2.6 %
HCT: 36.2 % — ABNORMAL LOW (ref 40.0–52.0)
HGB: 11.6 g/dL — AB (ref 13.0–18.0)
Lymphocyte #: 1 x10 3/mm (ref 1.0–3.6)
Lymphocyte %: 14.8 %
MCH: 31.1 pg (ref 26.0–34.0)
MCHC: 32.1 g/dL (ref 32.0–36.0)
MCV: 97 fL (ref 80–100)
MONOS PCT: 14.4 %
Monocyte #: 1 x10 3/mm (ref 0.2–1.0)
NEUTROS PCT: 67.1 %
Neutrophil #: 4.7 x10 3/mm (ref 1.4–6.5)
PLATELETS: 300 x10 3/mm (ref 150–440)
RBC: 3.73 10*6/uL — AB (ref 4.40–5.90)
RDW: 17.2 % — ABNORMAL HIGH (ref 11.5–14.5)
WBC: 7 x10 3/mm (ref 3.8–10.6)

## 2013-03-28 LAB — CBC CANCER CENTER
BASOS ABS: 0.1 x10 3/mm (ref 0.0–0.1)
BASOS PCT: 1.2 %
Eosinophil #: 0.2 x10 3/mm (ref 0.0–0.7)
Eosinophil %: 3.4 %
HCT: 38.2 % — AB (ref 40.0–52.0)
HGB: 12.4 g/dL — ABNORMAL LOW (ref 13.0–18.0)
LYMPHS PCT: 14.2 %
Lymphocyte #: 0.7 x10 3/mm — ABNORMAL LOW (ref 1.0–3.6)
MCH: 31.4 pg (ref 26.0–34.0)
MCHC: 32.5 g/dL (ref 32.0–36.0)
MCV: 97 fL (ref 80–100)
MONOS PCT: 10 %
Monocyte #: 0.5 x10 3/mm (ref 0.2–1.0)
NEUTROS ABS: 3.7 x10 3/mm (ref 1.4–6.5)
Neutrophil %: 71.2 %
PLATELETS: 276 x10 3/mm (ref 150–440)
RBC: 3.95 10*6/uL — ABNORMAL LOW (ref 4.40–5.90)
RDW: 16.7 % — ABNORMAL HIGH (ref 11.5–14.5)
WBC: 5.3 x10 3/mm (ref 3.8–10.6)

## 2013-04-01 ENCOUNTER — Ambulatory Visit: Payer: Self-pay | Admitting: Oncology

## 2013-04-04 LAB — CBC CANCER CENTER
BASOS ABS: 0.1 x10 3/mm (ref 0.0–0.1)
BASOS PCT: 1.1 %
Eosinophil #: 0.2 x10 3/mm (ref 0.0–0.7)
Eosinophil %: 1.3 %
HCT: 40.3 % (ref 40.0–52.0)
HGB: 12.9 g/dL — ABNORMAL LOW (ref 13.0–18.0)
LYMPHS ABS: 1.4 x10 3/mm (ref 1.0–3.6)
Lymphocyte %: 12 %
MCH: 30.7 pg (ref 26.0–34.0)
MCHC: 32 g/dL (ref 32.0–36.0)
MCV: 96 fL (ref 80–100)
MONO ABS: 1 x10 3/mm (ref 0.2–1.0)
MONOS PCT: 8.2 %
Neutrophil #: 9.1 x10 3/mm — ABNORMAL HIGH (ref 1.4–6.5)
Neutrophil %: 77.4 %
Platelet: 357 x10 3/mm (ref 150–440)
RBC: 4.21 10*6/uL — AB (ref 4.40–5.90)
RDW: 16.4 % — ABNORMAL HIGH (ref 11.5–14.5)
WBC: 11.8 x10 3/mm — ABNORMAL HIGH (ref 3.8–10.6)

## 2013-04-05 LAB — PSA: PSA: 27.5 ng/mL — AB (ref 0.0–4.0)

## 2013-04-11 LAB — COMPREHENSIVE METABOLIC PANEL
AST: 10 U/L — AB (ref 15–37)
Albumin: 2.9 g/dL — ABNORMAL LOW (ref 3.4–5.0)
Alkaline Phosphatase: 82 U/L
Anion Gap: 5 — ABNORMAL LOW (ref 7–16)
BUN: 33 mg/dL — ABNORMAL HIGH (ref 7–18)
Bilirubin,Total: 0.2 mg/dL (ref 0.2–1.0)
CALCIUM: 9 mg/dL (ref 8.5–10.1)
CREATININE: 1.86 mg/dL — AB (ref 0.60–1.30)
Chloride: 105 mmol/L (ref 98–107)
Co2: 29 mmol/L (ref 21–32)
EGFR (African American): 44 — ABNORMAL LOW
EGFR (Non-African Amer.): 38 — ABNORMAL LOW
GLUCOSE: 100 mg/dL — AB (ref 65–99)
OSMOLALITY: 285 (ref 275–301)
POTASSIUM: 4.9 mmol/L (ref 3.5–5.1)
SGPT (ALT): 7 U/L — ABNORMAL LOW (ref 12–78)
Sodium: 139 mmol/L (ref 136–145)
TOTAL PROTEIN: 6.6 g/dL (ref 6.4–8.2)

## 2013-04-11 LAB — CBC CANCER CENTER
BASOS ABS: 0.1 x10 3/mm (ref 0.0–0.1)
Basophil %: 1.4 %
EOS PCT: 5.3 %
Eosinophil #: 0.4 x10 3/mm (ref 0.0–0.7)
HCT: 36.9 % — AB (ref 40.0–52.0)
HGB: 11.9 g/dL — ABNORMAL LOW (ref 13.0–18.0)
LYMPHS ABS: 1.4 x10 3/mm (ref 1.0–3.6)
Lymphocyte %: 17.1 %
MCH: 30.7 pg (ref 26.0–34.0)
MCHC: 32.4 g/dL (ref 32.0–36.0)
MCV: 95 fL (ref 80–100)
Monocyte #: 1.1 x10 3/mm — ABNORMAL HIGH (ref 0.2–1.0)
Monocyte %: 14 %
NEUTROS ABS: 5 x10 3/mm (ref 1.4–6.5)
Neutrophil %: 62.2 %
Platelet: 349 x10 3/mm (ref 150–440)
RBC: 3.89 10*6/uL — AB (ref 4.40–5.90)
RDW: 16.6 % — ABNORMAL HIGH (ref 11.5–14.5)
WBC: 8 x10 3/mm (ref 3.8–10.6)

## 2013-05-02 ENCOUNTER — Ambulatory Visit: Payer: Self-pay | Admitting: Oncology

## 2013-05-02 LAB — COMPREHENSIVE METABOLIC PANEL
ALK PHOS: 83 U/L
ALT: 11 U/L — AB (ref 12–78)
AST: 12 U/L — AB (ref 15–37)
Albumin: 3 g/dL — ABNORMAL LOW (ref 3.4–5.0)
Anion Gap: 8 (ref 7–16)
BILIRUBIN TOTAL: 0.2 mg/dL (ref 0.2–1.0)
BUN: 39 mg/dL — ABNORMAL HIGH (ref 7–18)
CALCIUM: 9 mg/dL (ref 8.5–10.1)
CO2: 29 mmol/L (ref 21–32)
Chloride: 103 mmol/L (ref 98–107)
Creatinine: 1.93 mg/dL — ABNORMAL HIGH (ref 0.60–1.30)
GFR CALC AF AMER: 42 — AB
GFR CALC NON AF AMER: 36 — AB
GLUCOSE: 182 mg/dL — AB (ref 65–99)
OSMOLALITY: 293 (ref 275–301)
POTASSIUM: 4.3 mmol/L (ref 3.5–5.1)
Sodium: 140 mmol/L (ref 136–145)
Total Protein: 6.4 g/dL (ref 6.4–8.2)

## 2013-05-02 LAB — CBC CANCER CENTER
Basophil #: 0.1 x10 3/mm (ref 0.0–0.1)
Basophil %: 0.6 %
EOS ABS: 0.2 x10 3/mm (ref 0.0–0.7)
Eosinophil %: 2 %
HCT: 38.7 % — ABNORMAL LOW (ref 40.0–52.0)
HGB: 12.5 g/dL — AB (ref 13.0–18.0)
LYMPHS ABS: 1.1 x10 3/mm (ref 1.0–3.6)
Lymphocyte %: 10.1 %
MCH: 30.8 pg (ref 26.0–34.0)
MCHC: 32.4 g/dL (ref 32.0–36.0)
MCV: 95 fL (ref 80–100)
MONO ABS: 1.2 x10 3/mm — AB (ref 0.2–1.0)
Monocyte %: 11.3 %
Neutrophil #: 8.1 x10 3/mm — ABNORMAL HIGH (ref 1.4–6.5)
Neutrophil %: 76 %
Platelet: 252 x10 3/mm (ref 150–440)
RBC: 4.07 10*6/uL — ABNORMAL LOW (ref 4.40–5.90)
RDW: 16 % — AB (ref 11.5–14.5)
WBC: 10.6 x10 3/mm (ref 3.8–10.6)

## 2013-05-03 LAB — PSA: PSA: 23.8 ng/mL — ABNORMAL HIGH (ref 0.0–4.0)

## 2013-05-10 ENCOUNTER — Telehealth: Payer: Self-pay

## 2013-05-10 NOTE — Telephone Encounter (Signed)
Pt left v/m; pt request copy of medical records sent to pt or pt will pick up on 05/14/13;pt is filing for disability.

## 2013-05-14 ENCOUNTER — Ambulatory Visit (INDEPENDENT_AMBULATORY_CARE_PROVIDER_SITE_OTHER): Payer: Managed Care, Other (non HMO) | Admitting: Family Medicine

## 2013-05-14 ENCOUNTER — Other Ambulatory Visit: Payer: Self-pay | Admitting: Urology

## 2013-05-14 ENCOUNTER — Encounter: Payer: Self-pay | Admitting: Family Medicine

## 2013-05-14 VITALS — BP 114/82 | HR 60 | Temp 97.7°F | Wt 168.5 lb

## 2013-05-14 DIAGNOSIS — E119 Type 2 diabetes mellitus without complications: Secondary | ICD-10-CM

## 2013-05-14 DIAGNOSIS — F17200 Nicotine dependence, unspecified, uncomplicated: Secondary | ICD-10-CM

## 2013-05-14 DIAGNOSIS — R42 Dizziness and giddiness: Secondary | ICD-10-CM | POA: Insufficient documentation

## 2013-05-14 DIAGNOSIS — C61 Malignant neoplasm of prostate: Secondary | ICD-10-CM

## 2013-05-14 DIAGNOSIS — I10 Essential (primary) hypertension: Secondary | ICD-10-CM

## 2013-05-14 NOTE — Assessment & Plan Note (Signed)
Minimal - 1-2 cig/day.  Encouraged continued cessation attempts.

## 2013-05-14 NOTE — Assessment & Plan Note (Signed)
Discussed possible etiology including hypotension or hypoglycemia.  Encouraged he check blood pressure and sugar when next has episode. No recent episodes.

## 2013-05-14 NOTE — Assessment & Plan Note (Signed)
Chronic.  bp stable today. Will need to ensure dizzy episodes not due to hypotension.

## 2013-05-14 NOTE — Patient Instructions (Addendum)
Good to see you, i may get or request blood work at next visit. Bring latest blood work to next appointment Return in 4-5 months for follow up, sooner if needed.

## 2013-05-14 NOTE — Progress Notes (Signed)
BP 114/82  Pulse 60  Temp(Src) 97.7 F (36.5 C) (Oral)  Wt 168 lb 8 oz (76.431 kg)   CC: 3 mo f/u  Subjective:    Patient ID: Francisco Dunn, male    DOB: Sep 08, 1950, 63 y.o.   MRN: [id number]  HPI: Francisco Dunn is a 63 y.o. male presenting on 05/14/2013 for Follow-up   Metastatic prostate cancer - now on chemotherapy. s/p L percutaneous nephrostomy due to distal ureteral obstruction with internalization of stent by IR 12/5. Now has bilateral stents.  Chemo once a month with labwork each session, sees Dr. Dione Plover regularly.  Stopped working 02/2013.  DM - improved with weight loss and once prednisone was discontinued.  Doesn't check sugars. Lab Results  Component Value Date   HGBA1C 6.7* 10/09/2012   Wt Readings from Last 3 Encounters:  05/14/13 168 lb 8 oz (76.431 kg)  02/15/13 168 lb (76.204 kg)  02/12/13 162 lb 12 oz (73.823 kg)   Body mass index is 22.85 kg/(m^2). Lab Results  Component Value Date   CREATININE 2.40* 01/05/2013    1 fall in last year.  Due to dizzy spell associated with R leg weakness.  Rare episodes.  No LOC.  Relevant past medical, surgical, family and social history reviewed and updated as indicated.  Allergies and medications reviewed and updated. Current Outpatient Prescriptions on File Prior to Visit  Medication Sig  . aspirin 81 MG tablet Take 81 mg by mouth daily.  . carvedilol (COREG) 12.5 MG tablet Take 1 tablet (12.5 mg total) by mouth 2 (two) times daily.  Marland Kitchen docetaxel (TAXOTERE) 20 MG/0.5ML injection Inject into the vein every 21 ( twenty-one) days.  . hydrochlorothiazide (HYDRODIURIL) 25 MG tablet Take 25 mg by mouth daily with breakfast.  . Multiple Vitamin (MULTIVITAMIN) tablet Take 1 tablet by mouth daily.    No current facility-administered medications on file prior to visit.    Review of Systems Per HPI unless specifically indicated above    Objective:    BP 114/82  Pulse 60  Temp(Src) 97.7 F (36.5 C) (Oral)  Wt 168 lb 8 oz  (76.431 kg)  Physical Exam  Nursing note and vitals reviewed. Constitutional: He appears well-developed and well-nourished. No distress.  thin  HENT:  Mouth/Throat: Oropharynx is clear and moist. No oropharyngeal exudate.  Eyes: Conjunctivae and EOM are normal. Pupils are equal, round, and reactive to light.  Cardiovascular: Normal rate, regular rhythm, normal heart sounds and intact distal pulses.   No murmur heard. Pulmonary/Chest: Effort normal and breath sounds normal. No respiratory distress. He has no wheezes. He has no rales.  Musculoskeletal: He exhibits edema (tr pedal edema).  Skin: Skin is warm and dry. No rash noted.  Psychiatric: He has a normal mood and affect.   Results for orders placed in visit on 03/07/13  CBC      Result Value Ref Range   WBC 18.5     HGB 11.3     platelet count 343    LIPID PANEL      Result Value Ref Range   Cholesterol 257 (*) 0 - 200 mg/dL   Triglycerides 549     HDL 31 (*) 35 - 70 mg/dL      Assessment & Plan:   Problem List Items Addressed This Visit   Diabetes type 2, controlled - Primary     Check A1c next visit. Anticipate improvement with unfortunate weight loss from chemo/cancer. Now off prednisone.    HYPERTENSION  Chronic.  bp stable today. Will need to ensure dizzy episodes not due to hypotension.      Prostate cancer     Close f/u with onc.  Continue chemo at [facility] monthly, blood work checked at that time.    Smoker     Minimal - 1-2 cig/day.  Encouraged continued cessation attempts.    Dizziness     Discussed possible etiology including hypotension or hypoglycemia.  Encouraged he check blood pressure and sugar when next has episode. No recent episodes.        Follow up plan: No Follow-up on file.

## 2013-05-14 NOTE — Progress Notes (Signed)
Pre visit review using our clinic review tool, if applicable. No additional management support is needed unless otherwise documented below in the visit note. 

## 2013-05-14 NOTE — Assessment & Plan Note (Signed)
Close f/u with onc.  Continue chemo at [facility] monthly, blood work checked at that time.

## 2013-05-14 NOTE — Assessment & Plan Note (Signed)
Check A1c next visit. Anticipate improvement with unfortunate weight loss from chemo/cancer. Now off prednisone.

## 2013-05-28 ENCOUNTER — Telehealth: Payer: Self-pay

## 2013-05-28 NOTE — Telephone Encounter (Signed)
Relevant patient education assigned to patient using Emmi. ° °

## 2013-05-30 LAB — COMPREHENSIVE METABOLIC PANEL
ALT: 13 U/L (ref 12–78)
Albumin: 3.1 g/dL — ABNORMAL LOW (ref 3.4–5.0)
Alkaline Phosphatase: 94 U/L
Anion Gap: 6 — ABNORMAL LOW (ref 7–16)
BUN: 42 mg/dL — ABNORMAL HIGH (ref 7–18)
Bilirubin,Total: 0.3 mg/dL (ref 0.2–1.0)
CALCIUM: 9.2 mg/dL (ref 8.5–10.1)
Chloride: 105 mmol/L (ref 98–107)
Co2: 31 mmol/L (ref 21–32)
Creatinine: 1.85 mg/dL — ABNORMAL HIGH (ref 0.60–1.30)
EGFR (African American): 44 — ABNORMAL LOW
EGFR (Non-African Amer.): 38 — ABNORMAL LOW
Glucose: 59 mg/dL — ABNORMAL LOW (ref 65–99)
Osmolality: 291 (ref 275–301)
POTASSIUM: 4.9 mmol/L (ref 3.5–5.1)
SGOT(AST): 15 U/L (ref 15–37)
Sodium: 142 mmol/L (ref 136–145)
Total Protein: 7 g/dL (ref 6.4–8.2)

## 2013-05-30 LAB — CBC CANCER CENTER
Basophil #: 0.1 x10 3/mm (ref 0.0–0.1)
Basophil %: 1.1 %
EOS ABS: 1.2 x10 3/mm — AB (ref 0.0–0.7)
Eosinophil %: 15.4 %
HCT: 37.1 % — ABNORMAL LOW (ref 40.0–52.0)
HGB: 12.4 g/dL — ABNORMAL LOW (ref 13.0–18.0)
LYMPHS ABS: 1.4 x10 3/mm (ref 1.0–3.6)
LYMPHS PCT: 18.6 %
MCH: 30.5 pg (ref 26.0–34.0)
MCHC: 33.4 g/dL (ref 32.0–36.0)
MCV: 91 fL (ref 80–100)
MONOS PCT: 11.9 %
Monocyte #: 0.9 x10 3/mm (ref 0.2–1.0)
NEUTROS ABS: 4 x10 3/mm (ref 1.4–6.5)
Neutrophil %: 53 %
PLATELETS: 321 x10 3/mm (ref 150–440)
RBC: 4.06 10*6/uL — ABNORMAL LOW (ref 4.40–5.90)
RDW: 15.3 % — ABNORMAL HIGH (ref 11.5–14.5)
WBC: 7.5 x10 3/mm (ref 3.8–10.6)

## 2013-05-31 LAB — PSA: PSA: 37.5 ng/mL — ABNORMAL HIGH (ref 0.0–4.0)

## 2013-06-01 ENCOUNTER — Ambulatory Visit: Payer: Self-pay | Admitting: Oncology

## 2013-06-12 ENCOUNTER — Encounter (HOSPITAL_BASED_OUTPATIENT_CLINIC_OR_DEPARTMENT_OTHER): Payer: Self-pay | Admitting: *Deleted

## 2013-06-13 ENCOUNTER — Encounter (HOSPITAL_BASED_OUTPATIENT_CLINIC_OR_DEPARTMENT_OTHER): Payer: Self-pay | Admitting: *Deleted

## 2013-06-13 NOTE — Progress Notes (Signed)
NPO AFTER MN. ARRIVE AT 0600. NEEDS ISTAT.  CURRENT EKG IN CHART AND EPIC .  WILL TAKE COREG AM DOS W/ SIPS OF WATER. 

## 2013-06-17 NOTE — Anesthesia Preprocedure Evaluation (Addendum)
Anesthesia Evaluation  Patient identified by MRN, date of birth, ID band Patient awake    Reviewed: Allergy & Precautions, H&P , NPO status , Patient's Chart, lab work & pertinent test results  Airway Mallampati: II TM Distance: >3 FB Neck ROM: full    Dental  (+) Caps, Dental Advisory Given,  Cap right upper front:   Pulmonary former smoker,  breath sounds clear to auscultation  Pulmonary exam normal       Cardiovascular Exercise Tolerance: Good hypertension, Pt. on home beta blockers and Pt. on medications + CAD and + Past MI Rhythm:regular Rate:Normal  03/2012 Cath showed occluded mid left circumflex and minor irregularities in the LAD and RCA.  EF 55%   Neuro/Psych Spinal mets for prostate CA negative psych ROS   GI/Hepatic negative GI ROS, Neg liver ROS,   Endo/Other  diabetes, Well Controlled, Type 2, Oral Hypoglycemic Agents  Renal/GU Renal diseasenegative Renal ROS   Advanced prostate CA with mets to liver and spine    Musculoskeletal   Abdominal   Peds  Hematology negative hematology ROS (+)   Anesthesia Other Findings   Reproductive/Obstetrics negative OB ROS                           Anesthesia Physical  Anesthesia Plan  ASA: III  Anesthesia Plan: General   Post-op Pain Management:    Induction: Intravenous  Airway Management Planned: LMA  Additional Equipment:   Intra-op Plan:   Post-operative Plan: Extubation in OR  Informed Consent: I have reviewed the patients History and Physical, chart, labs and discussed the procedure including the risks, benefits and alternatives for the proposed anesthesia with the patient or authorized representative who has indicated his/her understanding and acceptance.   Dental advisory given  Plan Discussed with: CRNA  Anesthesia Plan Comments:         Anesthesia Quick Evaluation  

## 2013-06-18 ENCOUNTER — Ambulatory Visit (HOSPITAL_BASED_OUTPATIENT_CLINIC_OR_DEPARTMENT_OTHER): Payer: Managed Care, Other (non HMO) | Admitting: Anesthesiology

## 2013-06-18 ENCOUNTER — Encounter (HOSPITAL_BASED_OUTPATIENT_CLINIC_OR_DEPARTMENT_OTHER): Payer: Managed Care, Other (non HMO) | Admitting: Anesthesiology

## 2013-06-18 ENCOUNTER — Encounter (HOSPITAL_BASED_OUTPATIENT_CLINIC_OR_DEPARTMENT_OTHER)
Admission: RE | Disposition: A | Discharge status: Home / Self Care | Payer: Self-pay | Source: Ambulatory Visit | Attending: Urology

## 2013-06-18 ENCOUNTER — Ambulatory Visit (HOSPITAL_BASED_OUTPATIENT_CLINIC_OR_DEPARTMENT_OTHER)
Admission: RE | Admit: 2013-06-18 | Discharge: 2013-06-18 | Disposition: A | Discharge status: Home / Self Care | Payer: Managed Care, Other (non HMO) | Source: Ambulatory Visit | Attending: Urology | Admitting: Urology

## 2013-06-18 ENCOUNTER — Encounter (HOSPITAL_BASED_OUTPATIENT_CLINIC_OR_DEPARTMENT_OTHER): Payer: Self-pay | Admitting: *Deleted

## 2013-06-18 DIAGNOSIS — C7951 Secondary malignant neoplasm of bone: Secondary | ICD-10-CM | POA: Insufficient documentation

## 2013-06-18 DIAGNOSIS — C61 Malignant neoplasm of prostate: Secondary | ICD-10-CM | POA: Insufficient documentation

## 2013-06-18 DIAGNOSIS — I252 Old myocardial infarction: Secondary | ICD-10-CM | POA: Insufficient documentation

## 2013-06-18 DIAGNOSIS — I2510 Atherosclerotic heart disease of native coronary artery without angina pectoris: Secondary | ICD-10-CM | POA: Insufficient documentation

## 2013-06-18 DIAGNOSIS — C787 Secondary malignant neoplasm of liver and intrahepatic bile duct: Secondary | ICD-10-CM | POA: Insufficient documentation

## 2013-06-18 DIAGNOSIS — F17200 Nicotine dependence, unspecified, uncomplicated: Secondary | ICD-10-CM | POA: Insufficient documentation

## 2013-06-18 DIAGNOSIS — I10 Essential (primary) hypertension: Secondary | ICD-10-CM | POA: Insufficient documentation

## 2013-06-18 DIAGNOSIS — C7952 Secondary malignant neoplasm of bone marrow: Secondary | ICD-10-CM

## 2013-06-18 DIAGNOSIS — N135 Crossing vessel and stricture of ureter without hydronephrosis: Secondary | ICD-10-CM | POA: Insufficient documentation

## 2013-06-18 DIAGNOSIS — E119 Type 2 diabetes mellitus without complications: Secondary | ICD-10-CM | POA: Insufficient documentation

## 2013-06-18 HISTORY — DX: Essential (primary) hypertension: I10

## 2013-06-18 HISTORY — DX: Crossing vessel and stricture of ureter without hydronephrosis: N13.5

## 2013-06-18 HISTORY — PX: CYSTOSCOPY W/ URETERAL STENT PLACEMENT: SHX1429

## 2013-06-18 LAB — POCT I-STAT 4, (NA,K, GLUC, HGB,HCT)
Glucose, Bld: 117 mg/dL — ABNORMAL HIGH (ref 70–99)
HCT: 39 % (ref 39.0–52.0)
Hemoglobin: 13.3 g/dL (ref 13.0–17.0)
Potassium: 4.2 mEq/L (ref 3.7–5.3)
Sodium: 142 mEq/L (ref 137–147)

## 2013-06-18 LAB — GLUCOSE, CAPILLARY: Glucose-Capillary: 106 mg/dL — ABNORMAL HIGH (ref 70–99)

## 2013-06-18 SURGERY — CYSTOSCOPY, FLEXIBLE, WITH STENT REPLACEMENT
Anesthesia: General | Site: Ureter | Laterality: Bilateral

## 2013-06-18 MED ORDER — LACTATED RINGERS IV SOLN
INTRAVENOUS | Status: DC
  Filled 2013-06-18: qty 1000

## 2013-06-18 MED ORDER — CLOTRIMAZOLE-BETAMETHASONE 1-0.05 % EX CREA: 1.0000 "application " | TOPICAL_CREAM | Freq: Two times a day (BID) | CUTANEOUS | Status: DC

## 2013-06-18 MED ORDER — PROMETHAZINE HCL 25 MG/ML IJ SOLN
6.2500 mg | INTRAMUSCULAR | Status: DC | PRN
  Filled 2013-06-18: qty 1

## 2013-06-18 MED ORDER — BELLADONNA ALKALOIDS-OPIUM 16.2-60 MG RE SUPP
RECTAL | Status: DC | PRN
  Administered 2013-06-18: 1 via RECTAL

## 2013-06-18 MED ORDER — CIPROFLOXACIN IN D5W 200 MG/100ML IV SOLN
200.0000 mg | INTRAVENOUS | Status: AC
  Administered 2013-06-18: 200 mg via INTRAVENOUS
  Filled 2013-06-18: qty 100

## 2013-06-18 MED ORDER — DEXAMETHASONE SODIUM PHOSPHATE 4 MG/ML IJ SOLN
INTRAMUSCULAR | Status: DC | PRN
  Administered 2013-06-18: 10 mg via INTRAVENOUS

## 2013-06-18 MED ORDER — FENTANYL CITRATE 0.05 MG/ML IJ SOLN
25.0000 ug | INTRAMUSCULAR | Status: DC | PRN
  Filled 2013-06-18: qty 1

## 2013-06-18 MED ORDER — STERILE WATER FOR IRRIGATION IR SOLN
Status: DC | PRN
Start: 2013-06-18 — End: 2013-06-18
  Administered 2013-06-18: 6000 mL via INTRAVESICAL

## 2013-06-18 MED ORDER — PHENAZOPYRIDINE HCL 200 MG PO TABS: 200.0000 mg | ORAL_TABLET | Freq: Three times a day (TID) | ORAL | Status: DC | PRN

## 2013-06-18 MED ORDER — FENTANYL CITRATE 0.05 MG/ML IJ SOLN
INTRAMUSCULAR | Status: AC
  Filled 2013-06-18: qty 4

## 2013-06-18 MED ORDER — LACTATED RINGERS IV SOLN
INTRAVENOUS | Status: DC
  Administered 2013-06-18 (×2): via INTRAVENOUS
  Filled 2013-06-18: qty 1000

## 2013-06-18 MED ORDER — EPHEDRINE SULFATE 50 MG/ML IJ SOLN
INTRAMUSCULAR | Status: DC | PRN
  Administered 2013-06-18 (×3): 10 mg via INTRAVENOUS

## 2013-06-18 MED ORDER — BELLADONNA ALKALOIDS-OPIUM 16.2-60 MG RE SUPP
RECTAL | Status: AC
  Filled 2013-06-18: qty 1

## 2013-06-18 MED ORDER — LIDOCAINE HCL (CARDIAC) 20 MG/ML IV SOLN
INTRAVENOUS | Status: DC | PRN
  Administered 2013-06-18: 60 mg via INTRAVENOUS

## 2013-06-18 MED ORDER — STERILE WATER FOR IRRIGATION IR SOLN
Status: DC | PRN
  Administered 2013-06-18: 500 mL

## 2013-06-18 MED ORDER — PROPOFOL 10 MG/ML IV BOLUS
INTRAVENOUS | Status: DC | PRN
  Administered 2013-06-18: 150 mg via INTRAVENOUS

## 2013-06-18 MED ORDER — FENTANYL CITRATE 0.05 MG/ML IJ SOLN
INTRAMUSCULAR | Status: DC | PRN
  Administered 2013-06-18 (×2): 25 ug via INTRAVENOUS
  Administered 2013-06-18: 50 ug via INTRAVENOUS

## 2013-06-18 MED ORDER — IOHEXOL 350 MG/ML SOLN
INTRAVENOUS | Status: DC | PRN
  Administered 2013-06-18: 10 mL

## 2013-06-18 MED ORDER — ONDANSETRON HCL 4 MG/2ML IJ SOLN
INTRAMUSCULAR | Status: DC | PRN
  Administered 2013-06-18: 4 mg via INTRAVENOUS

## 2013-06-18 SURGICAL SUPPLY — 28 items
ADAPTER CATH URET PLST 4-6FR (CATHETERS) ×2 IMPLANT
ADPR CATH URET STRL DISP 4-6FR (CATHETERS) ×1
BAG DRAIN URO-CYSTO SKYTR STRL (DRAIN) ×2 IMPLANT
BAG DRN UROCATH (DRAIN) ×1
CANISTER SUCT LVC 12 LTR MEDI- (MISCELLANEOUS) ×2 IMPLANT
CATH INTERMIT  6FR 70CM (CATHETERS) ×2 IMPLANT
CATH URET 5FR 28IN CONE TIP (BALLOONS)
CATH URET 5FR 70CM CONE TIP (BALLOONS) IMPLANT
CLOTH BEACON ORANGE TIMEOUT ST (SAFETY) ×2 IMPLANT
DRAPE CAMERA CLOSED 9X96 (DRAPES) ×2 IMPLANT
GLOVE BIO SURGEON STRL SZ 6.5 (GLOVE) ×2 IMPLANT
GLOVE BIO SURGEON STRL SZ8 (GLOVE) ×2 IMPLANT
GLOVE INDICATOR 6.5 STRL GRN (GLOVE) ×6 IMPLANT
GOWN PREVENTION PLUS LG XLONG (DISPOSABLE) IMPLANT
GOWN STRL REIN XL XLG (GOWN DISPOSABLE) IMPLANT
GOWN STRL REUS W/ TWL LRG LVL3 (GOWN DISPOSABLE) ×1 IMPLANT
GOWN STRL REUS W/TWL LRG LVL3 (GOWN DISPOSABLE) ×2
GOWN STRL REUS W/TWL XL LVL3 (GOWN DISPOSABLE) ×2 IMPLANT
GUIDEWIRE 0.038 PTFE COATED (WIRE) IMPLANT
GUIDEWIRE ANG ZIPWIRE 038X150 (WIRE) IMPLANT
GUIDEWIRE STR DUAL SENSOR (WIRE) ×2 IMPLANT
IV NS IRRIG 3000ML ARTHROMATIC (IV SOLUTION) IMPLANT
NS IRRIG 500ML POUR BTL (IV SOLUTION) IMPLANT
PACK CYSTOSCOPY (CUSTOM PROCEDURE TRAY) ×2 IMPLANT
STENT ×4 IMPLANT
STENT URET 6FRX24 CONTOUR (STENTS) ×4 IMPLANT
WATER STERILE IRR 3000ML UROMA (IV SOLUTION) ×2 IMPLANT
WATER STERILE IRR 500ML POUR (IV SOLUTION) ×2 IMPLANT

## 2013-06-18 NOTE — Anesthesia Postprocedure Evaluation (Signed)
Anesthesia Post Note  Patient: Francisco Dunn  Procedure(s) Performed: Procedure(s) (LRB): CYSTOSCOPY WITH BILATERAL  STENT CHANGE (Bilateral)  Anesthesia type: General  Patient location: PACU  Post pain: Pain level controlled  Post assessment: Post-op Vital signs reviewed  Last Vitals:  Filed Vitals:   06/18/13 0944  BP: 117/73  Pulse: 51  Temp:   Resp: 14    Post vital signs: Reviewed  Level of consciousness: sedated  Complications: No apparent anesthesia complications

## 2013-06-18 NOTE — Op Note (Signed)
PATIENT:  Francisco Dunn  PRE-OPERATIVE DIAGNOSIS: Bilateral ureteral obstruction with double-J stents in place  POST-OPERATIVE DIAGNOSIS: Same  PROCEDURE: 1 cystoscopy with bilateral retrograde pyelogram including interpretation. 2. Bilateral ureteral stent removal. 3. Bilateral double-J stents placement.  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 He presents today for stent change.  ANESTHESIA:  General  EBL:  50 cc  DRAINS: 6 French, 24 cm double-J stents in the right and left ureters.  LOCAL MEDICATIONS USED:  2% lidocaine jelly per urethra  SPECIMEN:  None  Description of procedure: After informed consent the patient was taken to the operating room and placed on the table in a supine position. General anesthesia was then administered. Once fully anesthetized the patient was moved to the dorsal lithotomy position and the genitalia were sterilely prepped and draped in standard fashion. An official timeout was then performed.  The 31 French cystoscope was passed down the urethra using a 12 lens. Ureter was noted be normal. The prostatic urethra revealed some elongation but there were no definite prostatic urethral lesions. There was some friable prostatic mucosa at the level of the bladder neck and also nodular abnormalities of the trigonal region with involvement of both ureteral orifice. Stents were seen exiting both of the ureteral orifices.  An alligator grasper was passed through the cystoscope and the tip of the right double-J stent was grasped and withdrawn through the urethra. A 0.038 inch floppy-tipped sensor guidewire was then passed through the stent and into the area of the renal pelvis under direct fluoroscopy. I removed the stent and inspected. There was mild encrustation of the proximal portion. I then  inspected the ureteral orifice and noted the guidewire was passing into the orifice but it was too small to pass a open ended catheter next to the stent so I elected to pass the open-ended catheter over the stent and into the area the renal pelvis under fluoroscopy. I then removed the guidewire and injected full-strength contrast for a retrograde pyelogram.  A right retrograde pyelogram was performed by injecting full-strength contrast under direct fluoroscopic control. It revealed blunting of the calyces and an enlarged renal pelvis consistent with hydronephrosis of long-standing. There appeared to be no flow beyond the UPJ and as I withdrew the open-ended stent distal to the UPJ and injected it did appear to be any relative UPJ obstruction. The remainder of the ureter was visualized and noted to be irregular in appearance with obstruction primarily in the pelvis. I therefore replaced the guidewire through the open ended catheter and left the guidewire in place, backloaded the cystoscope and proceeded with right double-J stent placement.  The stent was passed over the guidewire and through the cystoscope and up the ureter under fluoroscopy into the area the renal pelvis. As I removed the guidewire there was good curl noted in the renal pelvis and in the bladder. I then turned my attention to the left ureteral orifice and proceeded with left ureteral stent removal in an identical fashion as described above. The stent did not appear to have significant encrustation. The retrograde pyelogram was also performed in an identical fashion and revealed an enlarged renal pelvis and blunting of the calyces on this side also consistent with long-standing hydronephrosis. The ureter also appeared obstructed in the pelvis. I then proceeded to replace the stent in an identical fashion as described above.  I then drained the bladder after confirming both stents to be in good position both proximally and distally, and  instilled lidocaine 2% jelly in the urethra and applied a penile clamp. The patient was awakened and taken recovery room in stable and satisfactory condition. He tolerated the procedure well and there were no intraoperative complications.    PLAN OF CARE: Discharge to home after PACU  PATIENT DISPOSITION:  PACU - hemodynamically stable.

## 2013-06-18 NOTE — Transfer of Care (Signed)
Immediate Anesthesia Transfer of Care Note  Patient: Francisco Dunn  Procedure(s) Performed: Procedure(s) (LRB): CYSTOSCOPY WITH BILATERAL  STENT CHANGE (Bilateral)  Patient Location: PACU  Anesthesia Type: General  Level of Consciousness: awake, alert  and oriented  Airway & Oxygen Therapy: Patient Spontanous Breathing and Patient connected to face mask oxygen  Post-op Assessment: Report given to PACU RN and Post -op Vital signs reviewed and stable  Post vital signs: Reviewed and stable  Complications: No apparent anesthesia complications

## 2013-06-18 NOTE — H&P (Signed)
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   History of Present Illness This is a 62 year old patient seen in follow up for evaluation of prostate cancer.  Date of diagnosis: 11/12  Type of treatment:  Androgen Deprivation, Leuprolide acetate  Pretreatment PSA: 59.66  Gleason score: 5+4=9                 He initially underwent a CT scan and bone scan which he reports were negative. He was started on [facility] agonist therapy indicating he likely had locally advanced disease. A CT scan done on 09/24/11 revealed diffuse retroperitoneal and pelvic adenopathy with focal sclerosis at T10 and L4 consistent with bony metastatic disease as well as a lobulated mass in the pelvis consistent with local extension. He has been under the care of an oncologist in [place], Dr. Jeb Dunn, who had maintained him on Lupron. His PSA nadar in 7/13 was 5.1. In 8/13 it had increased to 7.3. I started Casodex during his hospitalization. He has since been placed on Zytiga and prednisone.  Right hydronephrosis: He was seen in the hospital in 8/13 do to an elevated creatinine of 3.5. He had some mild renal insufficiency with a baseline creatinine of approximately 1.4. He was found to have right hydronephrosis with some mild left renal atrophy and therefore had a percutaneous nephrostomy tube placed which was eventually internalized to a double-J stent. This resulted in normalization of his creatinine back down to 1.32 in 9/13. 1/14 - Rt. stent change  Interval history: Urologically he's been doing pretty well. He said he occasionally has a slight bit of blood in his urine. No gross clots or significant bleeding. He's not having any pain from his stent. He has a stent that I replaced in his right ureter. A percutaneous nephrostomy tube was placed in his left kidney and a nephrostogram revealed obstruction at the level of the bladder likely from invasive prostate cancer. An  attempt to gain access to the bladder through this area was unsuccessful initially. He scheduled to return on 01/13/13 for another attempt at placing a double-J stent for internalization of his nephrostomy tube on the left.    Past Medical History Problems  1. History of  Coronary Artery Disease V12.59 2. History of  Diabetes Mellitus 250.00 3. History of  Hypertension 401.9  Surgical History Problems  1. History of  Cystoscopy With Insertion Of Ureteral Stent Right 2. History of  Wrist Surgery Right  Current Meds 1. AmLODIPine Besylate 10 MG Oral Tablet; Therapy: [id number] to  Allergies Medication  1. No Known Drug Allergies  Family History Problems  1. Family history of  Acute Myocardial Infarction V17.3 2. Family history of  Breast Cancer V16.3 3. Family history of  Cancer 4. Family history of  Prostate Cancer V16.42  Social History Problems  1. Caffeine Use 2. Current Smoker 305.1 1/2ppd x 10 3. Marital History - Currently Married Denied  4. History of  Alcohol Use  Review of Systems Genitourinary, constitutional, skin, eye, otolaryngeal, hematologic/lymphatic, cardiovascular, pulmonary, endocrine, musculoskeletal, gastrointestinal, neurological and psychiatric system(s) were reviewed and pertinent findings if present are noted.  Gastrointestinal: no flank pain and no abdominal pain.  Constitutional: no recent weight loss.  Musculoskeletal: no bone pain.     Vitals Vital Signs  BMI Calculated: 29.66 BSA Calculated: 2.22 Height: 6 ft  Weight: 219 lb  Blood Pressure: 148 / 88 Heart Rate: 67  Physical Exam Constitutional: Well nourished and  well developed . No acute distress.  ENT:. The ears and nose are normal in appearance.  Neck: The appearance of the neck is normal and no neck mass is present.  Pulmonary: No respiratory distress and normal respiratory rhythm and effort.  Cardiovascular: Heart rate and rhythm are normal . No peripheral edema.  Abdomen:  The abdomen is soft and nontender. No masses are palpated. No CVA tenderness. No hernias are palpable. No hepatosplenomegaly noted.  Genitourinary: Examination of the penis demonstrates normal phallus, no discharge, no masses, no lesions and a normal meatus. The scrotum is without lesions. The right epididymis is palpably normal and non-tender. The left epididymis is palpably normal and non-tender. The right testis is non-tender and without masses. The left testis is non-tender and without masses.  Lymphatics: The femoral and inguinal nodes are not enlarged or tender.  Skin: Normal skin turgor, no visible rash and no visible skin lesions.  Neuro/Psych:. Mood and affect are appropriate.   Impression: As far as his metastatic prostate cancer goes it appears he has developed castrate resistant disease and he was started on abiaterone + prednisone. He has an excellent performance status at this time and is completely asymptomatic. He does now however understand that his disease is not curable.  He has bilateral double-J stents in place.  He is due for stent change.  Plan  Plan: Cystoscopy with bilateral stent change.

## 2013-06-18 NOTE — Discharge Instructions (Signed)
Post stent placement instructions ° ° °Definitions: ° °Ureter: The duct that transports urine from the kidney to the bladder. °Stent: A plastic hollow tube that is placed into the ureter, from the kidney to the bladder to prevent the ureter from swelling shut. ° °General instructions: ° °Despite the fact that no skin incisions were used, the area around the ureter and bladder is raw and irritated. The stent is a foreign body which can further irritate the bladder wall. This irritation is manifested by increased frequency of urination, both day and night, and by an increase in the urge to urinate. In some, the urge to urinate is present almost always. Sometimes the urge is strong enough that you may not be able to stop your self from urinating. This can often be controlled with medication but does not occur in everyone. A stent can safely be left in place for 3 months or greater. ° °You may see some blood in your urine while the stent is in place and a few days afterward. Do not be alarmed, even if the urine is clear for a while. Get off your feet and drink lots of fluids until clearing occurs. If you start to pass clots or don't improve, call us. ° °Diet: ° °You may return to your normal diet immediately. Because of the raw surface of your bladder, alcohol, spicy foods, foods high in acid and drinks with caffeine may cause irritation or frequency and should be used in moderation. To keep your urine flowing freely and avoid constipation, drink plenty of fluids during the day (8-10 glasses). Tip: Avoid cranberry juice because it is very acidic. ° °Activity: ° °Your physical activity doesn't need to be restricted. However, if you are very active, you may see some blood in the urine. We suggest that you reduce your activity under the circumstances until the bleeding has stopped. ° °Bowels: ° °It is important to keep your bowels regular during the postoperative period. Straining with bowel movements can cause bleeding. A  bowel movement every other day is reasonable. Use a mild laxative if needed, such as milk of magnesia 2-3 tablespoons, or 2 Dulcolax tablets. Call if you continue to have problems. If you had been taking narcotics for pain, before, during or after your surgery, you may be constipated. Take a laxative if necessary. ° °Medication: ° °You should resume your pre-surgery medications unless told not to. In addition you may be given an antibiotic to prevent or treat infection. Antibiotics are not always necessary. All medication should be taken as prescribed until the bottles are finished unless you are having an unusual reaction to one of the drugs. ° °Problems you should report to us: ° °a. Fever greater than 101°F. °b. Heavy bleeding, or clots (see notes above about blood in urine). °c. Inability to urinate. °d. Drug reactions (hives, rash, nausea, vomiting, diarrhea). °e. Severe burning or pain with urination that is not improving. ° ° °Post Anesthesia Home Care Instructions ° °Activity: °Get plenty of rest for the remainder of the day. A responsible adult should stay with you for 24 hours following the procedure.  °For the next 24 hours, DO NOT: °-Drive a car °-Operate machinery °-Drink alcoholic beverages °-Take any medication unless instructed by your physician °-Make any legal decisions or sign important papers. ° °Meals: °Start with liquid foods such as gelatin or soup. Progress to regular foods as tolerated. Avoid greasy, spicy, heavy foods. If nausea and/or vomiting occur, drink only clear liquids until the nausea   nausea and/or vomiting subsides. Call your physician if vomiting continues. ° °Special Instructions/Symptoms: °Your throat may feel dry or sore from the anesthesia or the breathing tube placed in your throat during surgery. If this causes discomfort, gargle with warm salt water. The discomfort should disappear within 24 hours. ° °

## 2013-06-18 NOTE — Anesthesia Procedure Notes (Signed)
Procedure Name: LMA Insertion Date/Time: 06/18/2013 7:38 AM Performed by: Mechele Claude Pre-anesthesia Checklist: Patient identified, Emergency Drugs available, Suction available and Patient being monitored Patient Re-evaluated:Patient Re-evaluated prior to inductionOxygen Delivery Method: Circle System Utilized Preoxygenation: Pre-oxygenation with 100% oxygen Intubation Type: IV induction Ventilation: Mask ventilation without difficulty LMA: LMA inserted LMA Size: 4.0 Number of attempts: 1 Airway Equipment and Method: bite block Placement Confirmation: positive ETCO2 Tube secured with: Tape Dental Injury: Teeth and Oropharynx as per pre-operative assessment

## 2013-06-19 ENCOUNTER — Encounter (HOSPITAL_BASED_OUTPATIENT_CLINIC_OR_DEPARTMENT_OTHER): Payer: Self-pay | Admitting: Urology

## 2013-07-04 ENCOUNTER — Ambulatory Visit: Payer: Self-pay | Admitting: Oncology

## 2013-07-04 LAB — CBC CANCER CENTER
BASOS PCT: 1 %
Basophil #: 0.1 x10 3/mm (ref 0.0–0.1)
EOS PCT: 5.7 %
Eosinophil #: 0.5 x10 3/mm (ref 0.0–0.7)
HCT: 37.2 % — AB (ref 40.0–52.0)
HGB: 12.1 g/dL — ABNORMAL LOW (ref 13.0–18.0)
LYMPHS ABS: 0.9 x10 3/mm — AB (ref 1.0–3.6)
Lymphocyte %: 11 %
MCH: 29.2 pg (ref 26.0–34.0)
MCHC: 32.6 g/dL (ref 32.0–36.0)
MCV: 90 fL (ref 80–100)
MONO ABS: 0.8 x10 3/mm (ref 0.2–1.0)
Monocyte %: 9.4 %
NEUTROS ABS: 6.2 x10 3/mm (ref 1.4–6.5)
Neutrophil %: 72.9 %
Platelet: 339 x10 3/mm (ref 150–440)
RBC: 4.15 10*6/uL — AB (ref 4.40–5.90)
RDW: 15.4 % — ABNORMAL HIGH (ref 11.5–14.5)
WBC: 8.5 x10 3/mm (ref 3.8–10.6)

## 2013-07-04 LAB — COMPREHENSIVE METABOLIC PANEL
ALT: 10 U/L — AB (ref 12–78)
ANION GAP: 5 — AB (ref 7–16)
AST: 13 U/L — AB (ref 15–37)
Albumin: 3 g/dL — ABNORMAL LOW (ref 3.4–5.0)
Alkaline Phosphatase: 96 U/L
BUN: 39 mg/dL — AB (ref 7–18)
Bilirubin,Total: 0.4 mg/dL (ref 0.2–1.0)
CALCIUM: 9.2 mg/dL (ref 8.5–10.1)
Chloride: 105 mmol/L (ref 98–107)
Co2: 30 mmol/L (ref 21–32)
Creatinine: 1.83 mg/dL — ABNORMAL HIGH (ref 0.60–1.30)
EGFR (African American): 45 — ABNORMAL LOW
GFR CALC NON AF AMER: 38 — AB
Glucose: 88 mg/dL (ref 65–99)
OSMOLALITY: 288 (ref 275–301)
POTASSIUM: 4.5 mmol/L (ref 3.5–5.1)
Sodium: 140 mmol/L (ref 136–145)
Total Protein: 7 g/dL (ref 6.4–8.2)

## 2013-07-05 LAB — PSA: PSA: 51.6 ng/mL — ABNORMAL HIGH (ref 0.0–4.0)

## 2013-08-01 ENCOUNTER — Ambulatory Visit: Payer: Self-pay | Admitting: Oncology

## 2013-08-01 LAB — CBC CANCER CENTER
Basophil #: 0.1 x10 3/mm (ref 0.0–0.1)
Basophil %: 0.9 %
EOS PCT: 8.5 %
Eosinophil #: 0.7 x10 3/mm (ref 0.0–0.7)
HCT: 36.3 % — ABNORMAL LOW (ref 40.0–52.0)
HGB: 11.8 g/dL — ABNORMAL LOW (ref 13.0–18.0)
Lymphocyte #: 0.8 x10 3/mm — ABNORMAL LOW (ref 1.0–3.6)
Lymphocyte %: 9.6 %
MCH: 28.5 pg (ref 26.0–34.0)
MCHC: 32.5 g/dL (ref 32.0–36.0)
MCV: 88 fL (ref 80–100)
Monocyte #: 0.7 x10 3/mm (ref 0.2–1.0)
Monocyte %: 8.1 %
NEUTROS ABS: 6 x10 3/mm (ref 1.4–6.5)
Neutrophil %: 72.9 %
PLATELETS: 393 x10 3/mm (ref 150–440)
RBC: 4.13 10*6/uL — ABNORMAL LOW (ref 4.40–5.90)
RDW: 16.2 % — AB (ref 11.5–14.5)
WBC: 8.2 x10 3/mm (ref 3.8–10.6)

## 2013-08-01 LAB — COMPREHENSIVE METABOLIC PANEL
ALBUMIN: 2.9 g/dL — AB (ref 3.4–5.0)
ALK PHOS: 91 U/L
ALT: 13 U/L (ref 12–78)
AST: 17 U/L (ref 15–37)
Anion Gap: 9 (ref 7–16)
BUN: 44 mg/dL — ABNORMAL HIGH (ref 7–18)
Bilirubin,Total: 0.2 mg/dL (ref 0.2–1.0)
CO2: 26 mmol/L (ref 21–32)
Calcium, Total: 9 mg/dL (ref 8.5–10.1)
Chloride: 102 mmol/L (ref 98–107)
Creatinine: 2.04 mg/dL — ABNORMAL HIGH (ref 0.60–1.30)
EGFR (African American): 39 — ABNORMAL LOW
EGFR (Non-African Amer.): 34 — ABNORMAL LOW
GLUCOSE: 103 mg/dL — AB (ref 65–99)
Osmolality: 285 (ref 275–301)
POTASSIUM: 5 mmol/L (ref 3.5–5.1)
SODIUM: 137 mmol/L (ref 136–145)
Total Protein: 7.4 g/dL (ref 6.4–8.2)

## 2013-08-02 LAB — PSA: PSA: 76.6 ng/mL — AB (ref 0.0–4.0)

## 2013-08-08 LAB — COMPREHENSIVE METABOLIC PANEL
ALBUMIN: 2.9 g/dL — AB (ref 3.4–5.0)
ALK PHOS: 94 U/L
ALT: 12 U/L (ref 12–78)
AST: 17 U/L (ref 15–37)
Anion Gap: 7 (ref 7–16)
BUN: 34 mg/dL — ABNORMAL HIGH (ref 7–18)
Bilirubin,Total: 0.3 mg/dL (ref 0.2–1.0)
CO2: 32 mmol/L (ref 21–32)
Calcium, Total: 8.8 mg/dL (ref 8.5–10.1)
Chloride: 101 mmol/L (ref 98–107)
Creatinine: 2.21 mg/dL — ABNORMAL HIGH (ref 0.60–1.30)
EGFR (Non-African Amer.): 31 — ABNORMAL LOW
GFR CALC AF AMER: 35 — AB
Glucose: 176 mg/dL — ABNORMAL HIGH (ref 65–99)
Osmolality: 291 (ref 275–301)
POTASSIUM: 4 mmol/L (ref 3.5–5.1)
Sodium: 140 mmol/L (ref 136–145)
Total Protein: 7.2 g/dL (ref 6.4–8.2)

## 2013-08-08 LAB — CBC CANCER CENTER
BASOS PCT: 0.8 %
Basophil #: 0.1 x10 3/mm (ref 0.0–0.1)
EOS ABS: 0.6 x10 3/mm (ref 0.0–0.7)
EOS PCT: 7.6 %
HCT: 36.7 % — ABNORMAL LOW (ref 40.0–52.0)
HGB: 12.1 g/dL — ABNORMAL LOW (ref 13.0–18.0)
Lymphocyte #: 1 x10 3/mm (ref 1.0–3.6)
Lymphocyte %: 11.7 %
MCH: 28.6 pg (ref 26.0–34.0)
MCHC: 33 g/dL (ref 32.0–36.0)
MCV: 87 fL (ref 80–100)
MONOS PCT: 11 %
Monocyte #: 0.9 x10 3/mm (ref 0.2–1.0)
NEUTROS ABS: 5.6 x10 3/mm (ref 1.4–6.5)
Neutrophil %: 68.9 %
PLATELETS: 417 x10 3/mm (ref 150–440)
RBC: 4.24 10*6/uL — ABNORMAL LOW (ref 4.40–5.90)
RDW: 16.6 % — AB (ref 11.5–14.5)
WBC: 8.1 x10 3/mm (ref 3.8–10.6)

## 2013-08-22 LAB — CBC CANCER CENTER
Basophil #: 0.1 x10 3/mm (ref 0.0–0.1)
Basophil %: 0.9 %
Eosinophil #: 0.3 x10 3/mm (ref 0.0–0.7)
Eosinophil %: 5.4 %
HCT: 35.9 % — AB (ref 40.0–52.0)
HGB: 11.6 g/dL — AB (ref 13.0–18.0)
LYMPHS ABS: 0.8 x10 3/mm — AB (ref 1.0–3.6)
Lymphocyte %: 13.5 %
MCH: 27.4 pg (ref 26.0–34.0)
MCHC: 32.3 g/dL (ref 32.0–36.0)
MCV: 85 fL (ref 80–100)
MONO ABS: 0.6 x10 3/mm (ref 0.2–1.0)
MONOS PCT: 10.4 %
NEUTROS ABS: 4.3 x10 3/mm (ref 1.4–6.5)
Neutrophil %: 69.8 %
Platelet: 332 x10 3/mm (ref 150–440)
RBC: 4.24 10*6/uL — AB (ref 4.40–5.90)
RDW: 16.5 % — AB (ref 11.5–14.5)
WBC: 6.1 x10 3/mm (ref 3.8–10.6)

## 2013-08-22 LAB — COMPREHENSIVE METABOLIC PANEL
ANION GAP: 8 (ref 7–16)
Albumin: 3 g/dL — ABNORMAL LOW (ref 3.4–5.0)
Alkaline Phosphatase: 82 U/L
BUN: 34 mg/dL — ABNORMAL HIGH (ref 7–18)
Bilirubin,Total: 0.2 mg/dL (ref 0.2–1.0)
CALCIUM: 9.1 mg/dL (ref 8.5–10.1)
CHLORIDE: 101 mmol/L (ref 98–107)
Co2: 30 mmol/L (ref 21–32)
Creatinine: 2.21 mg/dL — ABNORMAL HIGH (ref 0.60–1.30)
EGFR (African American): 35 — ABNORMAL LOW
EGFR (Non-African Amer.): 31 — ABNORMAL LOW
Glucose: 129 mg/dL — ABNORMAL HIGH (ref 65–99)
Osmolality: 287 (ref 275–301)
Potassium: 4 mmol/L (ref 3.5–5.1)
SGOT(AST): 25 U/L (ref 15–37)
SGPT (ALT): 12 U/L — ABNORMAL LOW
Sodium: 139 mmol/L (ref 136–145)
TOTAL PROTEIN: 7.5 g/dL (ref 6.4–8.2)

## 2013-09-01 ENCOUNTER — Ambulatory Visit: Payer: Self-pay | Admitting: Oncology

## 2013-09-06 ENCOUNTER — Other Ambulatory Visit: Payer: Self-pay | Admitting: Urology

## 2013-09-12 ENCOUNTER — Ambulatory Visit (INDEPENDENT_AMBULATORY_CARE_PROVIDER_SITE_OTHER): Payer: Managed Care, Other (non HMO) | Admitting: Family Medicine

## 2013-09-12 ENCOUNTER — Encounter: Payer: Self-pay | Admitting: Family Medicine

## 2013-09-12 VITALS — BP 92/60 | HR 80 | Temp 97.9°F | Wt 174.5 lb

## 2013-09-12 DIAGNOSIS — I2510 Atherosclerotic heart disease of native coronary artery without angina pectoris: Secondary | ICD-10-CM

## 2013-09-12 DIAGNOSIS — N289 Disorder of kidney and ureter, unspecified: Secondary | ICD-10-CM

## 2013-09-12 DIAGNOSIS — K5900 Constipation, unspecified: Secondary | ICD-10-CM | POA: Insufficient documentation

## 2013-09-12 DIAGNOSIS — C800 Disseminated malignant neoplasm, unspecified: Secondary | ICD-10-CM

## 2013-09-12 DIAGNOSIS — C61 Malignant neoplasm of prostate: Secondary | ICD-10-CM

## 2013-09-12 DIAGNOSIS — E119 Type 2 diabetes mellitus without complications: Secondary | ICD-10-CM

## 2013-09-12 DIAGNOSIS — I10 Essential (primary) hypertension: Secondary | ICD-10-CM

## 2013-09-12 DIAGNOSIS — E782 Mixed hyperlipidemia: Secondary | ICD-10-CM

## 2013-09-12 LAB — HEMOGLOBIN A1C: Hgb A1c MFr Bld: 6.8 % — ABNORMAL HIGH (ref 4.6–6.5)

## 2013-09-12 LAB — CBC CANCER CENTER
Basophil #: 0.1 x10 3/mm (ref 0.0–0.1)
Basophil %: 0.5 %
EOS ABS: 0 x10 3/mm (ref 0.0–0.7)
EOS PCT: 0.1 %
HCT: 37.8 % — ABNORMAL LOW (ref 40.0–52.0)
HGB: 12 g/dL — ABNORMAL LOW (ref 13.0–18.0)
Lymphocyte #: 0.8 x10 3/mm — ABNORMAL LOW (ref 1.0–3.6)
Lymphocyte %: 4.5 %
MCH: 27.2 pg (ref 26.0–34.0)
MCHC: 31.8 g/dL — AB (ref 32.0–36.0)
MCV: 85 fL (ref 80–100)
MONO ABS: 1.1 x10 3/mm — AB (ref 0.2–1.0)
MONOS PCT: 6.7 %
NEUTROS PCT: 88.2 %
Neutrophil #: 14.7 x10 3/mm — ABNORMAL HIGH (ref 1.4–6.5)
Platelet: 270 x10 3/mm (ref 150–440)
RBC: 4.43 10*6/uL (ref 4.40–5.90)
RDW: 18.7 % — AB (ref 11.5–14.5)
WBC: 16.7 x10 3/mm — ABNORMAL HIGH (ref 3.8–10.6)

## 2013-09-12 LAB — COMPREHENSIVE METABOLIC PANEL
Albumin: 3.1 g/dL — ABNORMAL LOW (ref 3.4–5.0)
Alkaline Phosphatase: 123 U/L — ABNORMAL HIGH
Anion Gap: 10 (ref 7–16)
BUN: 50 mg/dL — ABNORMAL HIGH (ref 7–18)
Bilirubin,Total: 0.3 mg/dL (ref 0.2–1.0)
CALCIUM: 8.4 mg/dL — AB (ref 8.5–10.1)
CO2: 27 mmol/L (ref 21–32)
Chloride: 99 mmol/L (ref 98–107)
Creatinine: 2.49 mg/dL — ABNORMAL HIGH (ref 0.60–1.30)
GFR CALC AF AMER: 31 — AB
GFR CALC NON AF AMER: 26 — AB
Glucose: 173 mg/dL — ABNORMAL HIGH (ref 65–99)
Osmolality: 289 (ref 275–301)
POTASSIUM: 4.5 mmol/L (ref 3.5–5.1)
SGOT(AST): 27 U/L (ref 15–37)
SGPT (ALT): 16 U/L
SODIUM: 136 mmol/L (ref 136–145)
TOTAL PROTEIN: 6.7 g/dL (ref 6.4–8.2)

## 2013-09-12 LAB — RENAL FUNCTION PANEL
Albumin: 3.2 g/dL — ABNORMAL LOW (ref 3.5–5.2)
BUN: 49 mg/dL — ABNORMAL HIGH (ref 6–23)
CHLORIDE: 98 meq/L (ref 96–112)
CO2: 28 mEq/L (ref 19–32)
Calcium: 8.9 mg/dL (ref 8.4–10.5)
Creatinine, Ser: 2.3 mg/dL — ABNORMAL HIGH (ref 0.4–1.5)
GFR: 30.81 mL/min — AB (ref >60.00)
GLUCOSE: 120 mg/dL — AB (ref 70–99)
PHOSPHORUS: 3.7 mg/dL (ref 2.3–4.6)
Potassium: 4.7 mEq/L (ref 3.5–5.1)
Sodium: 135 mEq/L (ref 135–145)

## 2013-09-12 LAB — LIPID PANEL
CHOL/HDL RATIO: 6
Cholesterol: 228 mg/dL — ABNORMAL HIGH (ref 0–200)
HDL: 38 mg/dL — AB (ref >39.00)
LDL Cholesterol: 160 mg/dL — ABNORMAL HIGH (ref 0–99)
NONHDL: 190
Triglycerides: 150 mg/dL — ABNORMAL HIGH (ref 0.0–149.0)
VLDL: 30 mg/dL (ref 0.0–40.0)

## 2013-09-12 MED ORDER — CARVEDILOL 6.25 MG PO TABS: 6.2500 mg | ORAL_TABLET | Freq: Two times a day (BID) | ORAL | Status: AC

## 2013-09-12 NOTE — Assessment & Plan Note (Signed)
Continue to hold statin as on chemo. Check FLP today as fasting.

## 2013-09-12 NOTE — Assessment & Plan Note (Signed)
Chemotherapy related presumed. Discussed bowel regimen - continue colace and fiber supplement, add miralax daily OTC and increase water intake. Advised against using fleet enema, water or mineral oil enema would be ok. Pt prefers not to use suppository.

## 2013-09-12 NOTE — Assessment & Plan Note (Signed)
Hypotensive today - stop hctz, decrease coreg to 6.25mg  bid.

## 2013-09-12 NOTE — Assessment & Plan Note (Signed)
Check A1c today. Back on prednisone.

## 2013-09-12 NOTE — Assessment & Plan Note (Signed)
Check renal panel today. 

## 2013-09-12 NOTE — Patient Instructions (Addendum)
Blood pressure is low - stop hydrochlorothiazide. Decrease carvedilol to 1/2 tablet twice daily (I have sent lower dose to your pharmacy). For constipation - start miralax 17gm (capful) in 8 oz fluid daily, hold for diarrhea or loose stools. labwork today to check A1c and cholesterol levels Good to see you today, call us wth questions.

## 2013-09-12 NOTE — Progress Notes (Signed)
Pre visit review using our clinic review tool, if applicable. No additional management support is needed unless otherwise documented below in the visit note. 

## 2013-09-12 NOTE — Assessment & Plan Note (Signed)
Now on new chemotherapeutic agent monthly along with prednisone bid.

## 2013-09-12 NOTE — Assessment & Plan Note (Signed)
Given hypotensive today, will decrease coreg and stop hctz. Continue asa daily.

## 2013-09-12 NOTE — Progress Notes (Signed)
BP 92/60  Pulse 80  Temp(Src) 97.9 F (36.6 C) (Oral)  Wt 174 lb 8 oz (79.153 kg)   CC: 4 mo f/u  Subjective:    Patient ID: Francisco Dunn, male    DOB: 05-07-1950, 63 y.o.   MRN: [id number]  HPI: Francisco Dunn is a 63 y.o. male presenting on 09/12/2013 for Follow-up   HTN - on hctz 25mg  daily and carvedilol 12.5mg  bid. No more dizzy episodes. No pedal edema.   Wt Readings from Last 3 Encounters:  09/12/13 174 lb 8 oz (79.153 kg)  06/18/13 170 lb 8 oz (77.338 kg)  06/18/13 170 lb 8 oz (77.338 kg)   Prostate cancer metastatic to multiple sites Date: 2012 advanced prostate cancer spread to liver and spine, Gleason 5+4, on lupron per Dr. Len Blalock (onc), added casodex, then on abiaterone+prednisone now on cabazitaxel+prednisone (Ottelin). Started new chemo recently - may cause elevated blood pressure.  Significant constipation - side effect of chemo. Fleet enema helps. Has tried colace which hasn't helped.   Relevant past medical, surgical, family and social history reviewed and updated as indicated.  Allergies and medications reviewed and updated. Current Outpatient Prescriptions on File Prior to Visit  Medication Sig  . aspirin 81 MG tablet Take 81 mg by mouth daily.  . Multiple Vitamin (MULTIVITAMIN) tablet Take 1 tablet by mouth daily.    No current facility-administered medications on file prior to visit.    Review of Systems Per HPI unless specifically indicated above    Objective:    BP 92/60  Pulse 80  Temp(Src) 97.9 F (36.6 C) (Oral)  Wt 174 lb 8 oz (79.153 kg)  Physical Exam  Nursing note and vitals reviewed. Constitutional: He appears well-developed and well-nourished. No distress.  HENT:  Mouth/Throat: Oropharynx is clear and moist. No oropharyngeal exudate.  Cardiovascular: Normal rate, regular rhythm, normal heart sounds and intact distal pulses.   No murmur heard. Pulmonary/Chest: Effort normal and breath sounds normal. No respiratory distress. He has  no wheezes. He has no rales.  Musculoskeletal: He exhibits no edema.  Skin: Skin is warm and dry. No rash noted.      Assessment & Plan:   Problem List Items Addressed This Visit   Diabetes type 2, controlled - Primary     Check A1c today. Back on prednisone.    Relevant Orders      Hemoglobin A1c      Renal function panel   HYPERLIPIDEMIA, MIXED     Continue to hold statin as on chemo. Check FLP today as fasting.    Relevant Medications      carvedilol (COREG) tablet   Other Relevant Orders      Lipid panel   HYPERTENSION     Hypotensive today - stop hctz, decrease coreg to 6.25mg  bid.    Relevant Medications      carvedilol (COREG) tablet   Prostate cancer metastatic to multiple sites     Now on new chemotherapeutic agent monthly along with prednisone bid.    Relevant Medications      CABAZITAXEL IV      HYDROcodone-acetaminophen (NORCO/VICODIN) 5-325 MG per tablet      predniSONE (DELTASONE) 10 MG tablet      fentaNYL (DURAGESIC - DOSED MCG/HR) 25 MCG/HR patch   Renal insufficiency     Check renal panel today.    Coronary artery disease     Given hypotensive today, will decrease coreg and stop hctz. Continue asa daily.  Relevant Medications      carvedilol (COREG) tablet   Unspecified constipation     Chemotherapy related presumed. Discussed bowel regimen - continue colace and fiber supplement, add miralax daily OTC and increase water intake. Advised against using fleet enema, water or mineral oil enema would be ok. Pt prefers not to use suppository.        Follow up plan: Return if symptoms worsen or fail to improve.

## 2013-09-20 LAB — COMPREHENSIVE METABOLIC PANEL
ALK PHOS: 113 U/L
Albumin: 2.9 g/dL — ABNORMAL LOW (ref 3.4–5.0)
Anion Gap: 9 (ref 7–16)
BILIRUBIN TOTAL: 0.3 mg/dL (ref 0.2–1.0)
BUN: 43 mg/dL — ABNORMAL HIGH (ref 7–18)
CO2: 30 mmol/L (ref 21–32)
Calcium, Total: 8.2 mg/dL — ABNORMAL LOW (ref 8.5–10.1)
Chloride: 98 mmol/L (ref 98–107)
Creatinine: 2.62 mg/dL — ABNORMAL HIGH (ref 0.60–1.30)
EGFR (African American): 29 — ABNORMAL LOW
EGFR (Non-African Amer.): 25 — ABNORMAL LOW
Glucose: 186 mg/dL — ABNORMAL HIGH (ref 65–99)
OSMOLALITY: 290 (ref 275–301)
Potassium: 4.2 mmol/L (ref 3.5–5.1)
SGOT(AST): 14 U/L — ABNORMAL LOW (ref 15–37)
SGPT (ALT): 12 U/L — ABNORMAL LOW
SODIUM: 137 mmol/L (ref 136–145)
Total Protein: 6.3 g/dL — ABNORMAL LOW (ref 6.4–8.2)

## 2013-09-20 LAB — CBC CANCER CENTER
BASOS ABS: 0 x10 3/mm (ref 0.0–0.1)
Basophil %: 0.5 %
EOS ABS: 0.1 x10 3/mm (ref 0.0–0.7)
Eosinophil %: 1.6 %
HCT: 36.6 % — ABNORMAL LOW (ref 40.0–52.0)
HGB: 11.7 g/dL — ABNORMAL LOW (ref 13.0–18.0)
Lymphocyte #: 0.8 x10 3/mm — ABNORMAL LOW (ref 1.0–3.6)
Lymphocyte %: 11.8 %
MCH: 27.2 pg (ref 26.0–34.0)
MCHC: 32.1 g/dL (ref 32.0–36.0)
MCV: 85 fL (ref 80–100)
MONOS PCT: 6.4 %
Monocyte #: 0.4 x10 3/mm (ref 0.2–1.0)
NEUTROS PCT: 79.7 %
Neutrophil #: 5.5 x10 3/mm (ref 1.4–6.5)
Platelet: 283 x10 3/mm (ref 150–440)
RBC: 4.32 10*6/uL — AB (ref 4.40–5.90)
RDW: 19.2 % — ABNORMAL HIGH (ref 11.5–14.5)
WBC: 6.9 x10 3/mm (ref 3.8–10.6)

## 2013-09-21 LAB — CREATININE, SERUM
Creatinine: 2.36 mg/dL — ABNORMAL HIGH (ref 0.60–1.30)
EGFR (African American): 33 — ABNORMAL LOW
EGFR (Non-African Amer.): 28 — ABNORMAL LOW

## 2013-09-21 LAB — MAGNESIUM: Magnesium: 2.2 mg/dL

## 2013-09-21 LAB — POTASSIUM: Potassium: 4.2 mmol/L (ref 3.5–5.1)

## 2013-10-02 ENCOUNTER — Ambulatory Visit: Payer: Self-pay | Admitting: Oncology

## 2013-10-03 LAB — COMPREHENSIVE METABOLIC PANEL
ALBUMIN: 3 g/dL — AB (ref 3.4–5.0)
ALT: 16 U/L
Alkaline Phosphatase: 78 U/L
Anion Gap: 6 — ABNORMAL LOW (ref 7–16)
BUN: 43 mg/dL — ABNORMAL HIGH (ref 7–18)
Bilirubin,Total: 0.2 mg/dL (ref 0.2–1.0)
CALCIUM: 8.5 mg/dL (ref 8.5–10.1)
CHLORIDE: 103 mmol/L (ref 98–107)
Co2: 30 mmol/L (ref 21–32)
Creatinine: 2.34 mg/dL — ABNORMAL HIGH (ref 0.60–1.30)
EGFR (African American): 33 — ABNORMAL LOW
GFR CALC NON AF AMER: 29 — AB
GLUCOSE: 169 mg/dL — AB (ref 65–99)
OSMOLALITY: 292 (ref 275–301)
POTASSIUM: 5.5 mmol/L — AB (ref 3.5–5.1)
SGOT(AST): 20 U/L (ref 15–37)
SODIUM: 139 mmol/L (ref 136–145)
TOTAL PROTEIN: 6.5 g/dL (ref 6.4–8.2)

## 2013-10-03 LAB — CBC CANCER CENTER
Basophil #: 0.1 x10 3/mm (ref 0.0–0.1)
Basophil %: 0.7 %
EOS ABS: 0 x10 3/mm (ref 0.0–0.7)
Eosinophil %: 0.2 %
HCT: 38.8 % — AB (ref 40.0–52.0)
HGB: 12.3 g/dL — ABNORMAL LOW (ref 13.0–18.0)
LYMPHS ABS: 0.8 x10 3/mm — AB (ref 1.0–3.6)
LYMPHS PCT: 6.3 %
MCH: 27.5 pg (ref 26.0–34.0)
MCHC: 31.8 g/dL — AB (ref 32.0–36.0)
MCV: 87 fL (ref 80–100)
MONO ABS: 0.6 x10 3/mm (ref 0.2–1.0)
MONOS PCT: 4.3 %
NEUTROS ABS: 12 x10 3/mm — AB (ref 1.4–6.5)
NEUTROS PCT: 88.5 %
Platelet: 247 x10 3/mm (ref 150–440)
RBC: 4.48 10*6/uL (ref 4.40–5.90)
RDW: 20.4 % — ABNORMAL HIGH (ref 11.5–14.5)
WBC: 13.6 x10 3/mm — AB (ref 3.8–10.6)

## 2013-10-04 LAB — PSA: PSA: 65.5 ng/mL — ABNORMAL HIGH (ref 0.0–4.0)

## 2013-10-10 LAB — COMPREHENSIVE METABOLIC PANEL
ALBUMIN: 3.1 g/dL — AB (ref 3.4–5.0)
ALK PHOS: 128 U/L — AB
AST: 18 U/L (ref 15–37)
Anion Gap: 11 (ref 7–16)
BUN: 44 mg/dL — AB (ref 7–18)
Bilirubin,Total: 0.3 mg/dL (ref 0.2–1.0)
CALCIUM: 8.6 mg/dL (ref 8.5–10.1)
CHLORIDE: 101 mmol/L (ref 98–107)
CO2: 27 mmol/L (ref 21–32)
Creatinine: 3.34 mg/dL — ABNORMAL HIGH (ref 0.60–1.30)
EGFR (African American): 22 — ABNORMAL LOW
GFR CALC NON AF AMER: 19 — AB
Glucose: 116 mg/dL — ABNORMAL HIGH (ref 65–99)
Osmolality: 290 (ref 275–301)
POTASSIUM: 4 mmol/L (ref 3.5–5.1)
SGPT (ALT): 13 U/L — ABNORMAL LOW
Sodium: 139 mmol/L (ref 136–145)
TOTAL PROTEIN: 6.7 g/dL (ref 6.4–8.2)

## 2013-10-10 LAB — CBC CANCER CENTER
BASOS PCT: 0.5 %
Basophil #: 0.1 x10 3/mm (ref 0.0–0.1)
Eosinophil #: 0.1 x10 3/mm (ref 0.0–0.7)
Eosinophil %: 0.6 %
HCT: 37.1 % — AB (ref 40.0–52.0)
HGB: 11.8 g/dL — AB (ref 13.0–18.0)
LYMPHS ABS: 0.9 x10 3/mm — AB (ref 1.0–3.6)
Lymphocyte %: 6.7 %
MCH: 27 pg (ref 26.0–34.0)
MCHC: 31.9 g/dL — ABNORMAL LOW (ref 32.0–36.0)
MCV: 85 fL (ref 80–100)
MONOS PCT: 6.7 %
Monocyte #: 1 x10 3/mm (ref 0.2–1.0)
NEUTROS PCT: 85.5 %
Neutrophil #: 12.2 x10 3/mm — ABNORMAL HIGH (ref 1.4–6.5)
PLATELETS: 285 x10 3/mm (ref 150–440)
RBC: 4.37 10*6/uL — AB (ref 4.40–5.90)
RDW: 20.5 % — AB (ref 11.5–14.5)
WBC: 14.3 x10 3/mm — AB (ref 3.8–10.6)

## 2013-10-24 LAB — CBC CANCER CENTER
BASOS PCT: 0.6 %
Basophil #: 0.1 x10 3/mm (ref 0.0–0.1)
EOS PCT: 0.3 %
Eosinophil #: 0 x10 3/mm (ref 0.0–0.7)
HCT: 33.5 % — ABNORMAL LOW (ref 40.0–52.0)
HGB: 10.6 g/dL — ABNORMAL LOW (ref 13.0–18.0)
LYMPHS PCT: 3.9 %
Lymphocyte #: 0.5 x10 3/mm — ABNORMAL LOW (ref 1.0–3.6)
MCH: 27.1 pg (ref 26.0–34.0)
MCHC: 31.7 g/dL — ABNORMAL LOW (ref 32.0–36.0)
MCV: 85 fL (ref 80–100)
MONO ABS: 1.1 x10 3/mm — AB (ref 0.2–1.0)
MONOS PCT: 8.8 %
NEUTROS PCT: 86.4 %
Neutrophil #: 10.6 x10 3/mm — ABNORMAL HIGH (ref 1.4–6.5)
PLATELETS: 324 x10 3/mm (ref 150–440)
RBC: 3.92 10*6/uL — ABNORMAL LOW (ref 4.40–5.90)
RDW: 20.8 % — ABNORMAL HIGH (ref 11.5–14.5)
WBC: 12.3 x10 3/mm — ABNORMAL HIGH (ref 3.8–10.6)

## 2013-10-24 LAB — COMPREHENSIVE METABOLIC PANEL
ALT: 13 U/L — AB
ANION GAP: 7 (ref 7–16)
Albumin: 2.6 g/dL — ABNORMAL LOW (ref 3.4–5.0)
Alkaline Phosphatase: 76 U/L
BUN: 56 mg/dL — ABNORMAL HIGH (ref 7–18)
Bilirubin,Total: 0.3 mg/dL (ref 0.2–1.0)
CHLORIDE: 96 mmol/L — AB (ref 98–107)
Calcium, Total: 8.9 mg/dL (ref 8.5–10.1)
Co2: 29 mmol/L (ref 21–32)
Creatinine: 6.09 mg/dL — ABNORMAL HIGH (ref 0.60–1.30)
EGFR (African American): 12 — ABNORMAL LOW
EGFR (Non-African Amer.): 10 — ABNORMAL LOW
Glucose: 131 mg/dL — ABNORMAL HIGH (ref 65–99)
Osmolality: 282 (ref 275–301)
Potassium: 5 mmol/L (ref 3.5–5.1)
SGOT(AST): 26 U/L (ref 15–37)
Sodium: 132 mmol/L — ABNORMAL LOW (ref 136–145)
TOTAL PROTEIN: 6.5 g/dL (ref 6.4–8.2)

## 2013-10-24 LAB — CBC AND DIFFERENTIAL
Hemoglobin: 10.6 g/dL — AB (ref 13.5–17.5)
Platelets: 324 10*3/uL (ref 150–399)
WBC: 12.3 10^3/mL

## 2013-10-24 LAB — HEPATIC FUNCTION PANEL
ALT: 13 U/L (ref 10–40)
AST: 26 U/L (ref 14–40)

## 2013-10-24 LAB — BASIC METABOLIC PANEL
BUN: 56 mg/dL — AB (ref 4–21)
Creatinine: 6.1 mg/dL — AB (ref <=1.3)
Potassium: 5 mmol/L (ref 3.4–5.3)
Sodium: 132 mmol/L — AB (ref 137–147)

## 2013-10-25 ENCOUNTER — Encounter (HOSPITAL_COMMUNITY): Payer: Self-pay | Admitting: Pharmacy Technician

## 2013-10-25 ENCOUNTER — Encounter (HOSPITAL_COMMUNITY): Payer: Self-pay | Admitting: *Deleted

## 2013-10-25 ENCOUNTER — Encounter: Payer: Self-pay | Admitting: Family Medicine

## 2013-10-26 ENCOUNTER — Ambulatory Visit (HOSPITAL_COMMUNITY)
Admission: RE | Admit: 2013-10-26 | Discharge: 2013-10-26 | Disposition: A | Discharge status: Home / Self Care | Payer: Managed Care, Other (non HMO) | Source: Ambulatory Visit | Attending: Urology | Admitting: Urology

## 2013-10-26 ENCOUNTER — Encounter (HOSPITAL_COMMUNITY)
Admission: RE | Disposition: A | Discharge status: Home / Self Care | Payer: Self-pay | Source: Ambulatory Visit | Attending: Urology

## 2013-10-26 ENCOUNTER — Encounter (HOSPITAL_COMMUNITY): Payer: Managed Care, Other (non HMO) | Admitting: Anesthesiology

## 2013-10-26 ENCOUNTER — Ambulatory Visit (HOSPITAL_COMMUNITY): Payer: Managed Care, Other (non HMO)

## 2013-10-26 ENCOUNTER — Ambulatory Visit (HOSPITAL_COMMUNITY): Payer: Managed Care, Other (non HMO) | Admitting: Anesthesiology

## 2013-10-26 ENCOUNTER — Encounter (HOSPITAL_COMMUNITY): Payer: Self-pay

## 2013-10-26 DIAGNOSIS — I2510 Atherosclerotic heart disease of native coronary artery without angina pectoris: Secondary | ICD-10-CM | POA: Insufficient documentation

## 2013-10-26 DIAGNOSIS — E119 Type 2 diabetes mellitus without complications: Secondary | ICD-10-CM | POA: Diagnosis not present

## 2013-10-26 DIAGNOSIS — N1330 Unspecified hydronephrosis: Secondary | ICD-10-CM | POA: Diagnosis not present

## 2013-10-26 DIAGNOSIS — F17200 Nicotine dependence, unspecified, uncomplicated: Secondary | ICD-10-CM | POA: Diagnosis not present

## 2013-10-26 DIAGNOSIS — I10 Essential (primary) hypertension: Secondary | ICD-10-CM | POA: Insufficient documentation

## 2013-10-26 DIAGNOSIS — N135 Crossing vessel and stricture of ureter without hydronephrosis: Secondary | ICD-10-CM

## 2013-10-26 DIAGNOSIS — R944 Abnormal results of kidney function studies: Secondary | ICD-10-CM | POA: Diagnosis not present

## 2013-10-26 DIAGNOSIS — Z466 Encounter for fitting and adjustment of urinary device: Secondary | ICD-10-CM | POA: Diagnosis not present

## 2013-10-26 HISTORY — PX: CYSTOSCOPY WITH URETEROSCOPY AND STENT PLACEMENT: SHX6377

## 2013-10-26 LAB — BASIC METABOLIC PANEL
Anion gap: 16 — ABNORMAL HIGH (ref 5–15)
BUN: 60 mg/dL — ABNORMAL HIGH (ref 6–23)
CO2: 23 mEq/L (ref 19–32)
Calcium: 8.8 mg/dL (ref 8.4–10.5)
Chloride: 94 mEq/L — ABNORMAL LOW (ref 96–112)
Creatinine, Ser: 6.1 mg/dL — ABNORMAL HIGH (ref 0.50–1.35)
GFR calc Af Amer: 10 mL/min — ABNORMAL LOW (ref >90)
GFR calc non Af Amer: 9 mL/min — ABNORMAL LOW (ref >90)
Glucose, Bld: 121 mg/dL — ABNORMAL HIGH (ref 70–99)
Potassium: 4.7 mEq/L (ref 3.7–5.3)
Sodium: 133 mEq/L — ABNORMAL LOW (ref 137–147)

## 2013-10-26 LAB — GLUCOSE, CAPILLARY
Glucose-Capillary: 119 mg/dL — ABNORMAL HIGH (ref 70–99)
Glucose-Capillary: 126 mg/dL — ABNORMAL HIGH (ref 70–99)

## 2013-10-26 SURGERY — CYSTOURETEROSCOPY, WITH STENT INSERTION
Anesthesia: General | Laterality: Bilateral

## 2013-10-26 MED ORDER — FENTANYL CITRATE 0.05 MG/ML IJ SOLN
INTRAMUSCULAR | Status: AC
Start: 2013-10-26 — End: 2013-10-26
  Filled 2013-10-26: qty 2

## 2013-10-26 MED ORDER — PHENAZOPYRIDINE HCL 200 MG PO TABS: 200.0000 mg | ORAL_TABLET | Freq: Three times a day (TID) | ORAL | Status: AC | PRN

## 2013-10-26 MED ORDER — MEPERIDINE HCL 50 MG/ML IJ SOLN: 6.2500 mg | INTRAMUSCULAR | Status: DC | PRN

## 2013-10-26 MED ORDER — IOHEXOL 300 MG/ML  SOLN
INTRAMUSCULAR | Status: DC | PRN
  Administered 2013-10-26: 30 mL via URETHRAL

## 2013-10-26 MED ORDER — CIPROFLOXACIN IN D5W 200 MG/100ML IV SOLN
200.0000 mg | INTRAVENOUS | Status: AC
  Administered 2013-10-26: 200 mg via INTRAVENOUS
  Filled 2013-10-26: qty 100

## 2013-10-26 MED ORDER — PHENYLEPHRINE HCL 10 MG/ML IJ SOLN
INTRAMUSCULAR | Status: DC | PRN
  Administered 2013-10-26: 80 ug via INTRAVENOUS
  Administered 2013-10-26: 120 ug via INTRAVENOUS

## 2013-10-26 MED ORDER — PROMETHAZINE HCL 25 MG/ML IJ SOLN: 6.2500 mg | INTRAMUSCULAR | Status: DC | PRN

## 2013-10-26 MED ORDER — METOCLOPRAMIDE HCL 5 MG/ML IJ SOLN
INTRAMUSCULAR | Status: AC
  Filled 2013-10-26: qty 2

## 2013-10-26 MED ORDER — LACTATED RINGERS IV SOLN
INTRAVENOUS | Status: DC | PRN
  Administered 2013-10-26: 07:00:00 via INTRAVENOUS

## 2013-10-26 MED ORDER — FENTANYL CITRATE 0.05 MG/ML IJ SOLN
INTRAMUSCULAR | Status: AC
  Filled 2013-10-26: qty 2

## 2013-10-26 MED ORDER — FENTANYL CITRATE 0.05 MG/ML IJ SOLN
INTRAMUSCULAR | Status: DC | PRN
  Administered 2013-10-26 (×3): 50 ug via INTRAVENOUS

## 2013-10-26 MED ORDER — MIDAZOLAM HCL 2 MG/2ML IJ SOLN
INTRAMUSCULAR | Status: AC
  Filled 2013-10-26: qty 2

## 2013-10-26 MED ORDER — ONDANSETRON HCL 4 MG/2ML IJ SOLN
INTRAMUSCULAR | Status: AC
Start: 2013-10-26 — End: 2013-10-26
  Filled 2013-10-26: qty 2

## 2013-10-26 MED ORDER — OXYCODONE-ACETAMINOPHEN 10-325 MG PO TABS: 1.0000 | ORAL_TABLET | ORAL | Status: AC | PRN

## 2013-10-26 MED ORDER — PROPOFOL 10 MG/ML IV BOLUS
INTRAVENOUS | Status: DC | PRN
  Administered 2013-10-26: 140 mg via INTRAVENOUS

## 2013-10-26 MED ORDER — LACTATED RINGERS IV SOLN
INTRAVENOUS | Status: DC
  Administered 2013-10-26: 10:00:00 via INTRAVENOUS

## 2013-10-26 MED ORDER — MIDAZOLAM HCL 5 MG/5ML IJ SOLN
INTRAMUSCULAR | Status: DC | PRN
  Administered 2013-10-26: 2 mg via INTRAVENOUS

## 2013-10-26 MED ORDER — LACTATED RINGERS IV SOLN: INTRAVENOUS | Status: DC

## 2013-10-26 MED ORDER — METOCLOPRAMIDE HCL 5 MG/ML IJ SOLN
INTRAMUSCULAR | Status: DC | PRN
  Administered 2013-10-26: 10 mg via INTRAVENOUS

## 2013-10-26 MED ORDER — ONDANSETRON HCL 4 MG/2ML IJ SOLN
INTRAMUSCULAR | Status: DC | PRN
  Administered 2013-10-26: 4 mg via INTRAVENOUS

## 2013-10-26 MED ORDER — PHENYLEPHRINE 40 MCG/ML (10ML) SYRINGE FOR IV PUSH (FOR BLOOD PRESSURE SUPPORT)
PREFILLED_SYRINGE | INTRAVENOUS | Status: AC
  Filled 2013-10-26: qty 10

## 2013-10-26 MED ORDER — PROPOFOL 10 MG/ML IV BOLUS
INTRAVENOUS | Status: AC
  Filled 2013-10-26: qty 20

## 2013-10-26 MED ORDER — SODIUM CHLORIDE 0.9 % IR SOLN
Status: DC | PRN
  Administered 2013-10-26: 3000 mL

## 2013-10-26 MED ORDER — FENTANYL CITRATE 0.05 MG/ML IJ SOLN
25.0000 ug | INTRAMUSCULAR | Status: DC | PRN
  Administered 2013-10-26 (×2): 50 ug via INTRAVENOUS

## 2013-10-26 SURGICAL SUPPLY — 21 items
BAG URINE DRAINAGE (UROLOGICAL SUPPLIES) ×2 IMPLANT
BAG URO CATCHER STRL LF (DRAPE) ×2 IMPLANT
BASKET ZERO TIP NITINOL 2.4FR (BASKET) IMPLANT
BSKT STON RTRVL ZERO TP 2.4FR (BASKET)
CATH INTERMIT  6FR 70CM (CATHETERS) ×2 IMPLANT
CLOTH BEACON ORANGE TIMEOUT ST (SAFETY) ×2 IMPLANT
DRAPE CAMERA CLOSED 9X96 (DRAPES) ×2 IMPLANT
GLOVE BIOGEL M 8.0 STRL (GLOVE) ×2 IMPLANT
GOWN STRL REUS W/ TWL XL LVL3 (GOWN DISPOSABLE) ×1 IMPLANT
GOWN STRL REUS W/TWL XL LVL3 (GOWN DISPOSABLE) ×4 IMPLANT
GUIDEWIRE ANG ZIPWIRE 038X150 (WIRE) IMPLANT
GUIDEWIRE STR DUAL SENSOR (WIRE) ×4 IMPLANT
KIT BALLN UROMAX 15FX4 (MISCELLANEOUS) ×1 IMPLANT
KIT BALLN UROMAX 26 75X4 (MISCELLANEOUS) ×1
MANIFOLD NEPTUNE II (INSTRUMENTS) ×2 IMPLANT
PACK CYSTO (CUSTOM PROCEDURE TRAY) ×2 IMPLANT
SHEATH URET ACCESS 12FR/35CM (UROLOGICAL SUPPLIES) ×2 IMPLANT
STENT CONTOUR 6FRX24X.038 (STENTS) ×2 IMPLANT
STENT URO INLAY 6FRX26CM (STENTS) ×4 IMPLANT
SYRINGE IRR TOOMEY STRL 70CC (SYRINGE) ×2 IMPLANT
TUBING CONNECTING 10 (TUBING) ×2 IMPLANT

## 2013-10-26 NOTE — H&P (Signed)
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   History of Present Illness  This is a 63 year old patient seen in follow up for evaluation of prostate cancer.  Date of diagnosis: 11/12  Type of treatment:  Androgen Deprivation, Leuprolide acetate  Pretreatment PSA: 59.66  Gleason score: 5+4=9   He initially underwent a CT scan and bone scan which he reports were negative. He was started on [facility] agonist therapy indicating he likely had locally advanced disease. A CT scan done on 09/24/11 revealed diffuse retroperitoneal and pelvic adenopathy with focal sclerosis at T10 and L4 consistent with bony metastatic disease as well as a lobulated mass in the pelvis consistent with local extension. He has been under the care of an oncologist in [place], Dr. Jeb Levering, who had maintained him on Lupron. His PSA nadar in 7/13 was 5.1. In 8/13 it had increased to 7.3. I started Casodex during his hospitalization. He has been on Zytiga, Taxotere and now is on cabazitaxel.     Bilateral hydronephrosis: He was seen in the hospital in 8/13 do to an elevated creatinine of 3.5. He had some mild renal insufficiency with a baseline creatinine of approximately 1.4. He was found to have right hydronephrosis with some mild left renal atrophy and therefore had a percutaneous nephrostomy tube placed which was eventually internalized to a double-J stent. This resulted in normalization of his creatinine back down to 1.32 in 9/13.  He then developed and elevating creatinine again and was found to have developed left hydronephrosis. He was noted cystoscopically to have invasion of the trigone and initially had a left nephrostomy tube placed followed by internalization with a double-J stent.  Last stent change: 06/18/13 - bilateral    Interval history: He has returned today because of an elevated creatinine. His creatinine on 10/24/13 was noted to be 6.09. His potassium at  that time was normal at 5.0. He said he is also developed gross hematuria. He is still able to void without difficulty and has a lot of irritative voiding symptoms likely due to the progression of his prostate cancer locally.        Past Medical History Problems  1. History of Coronary Artery Disease (V12.59) 2. History of diabetes mellitus (V12.29) 3. History of hypertension (V12.59)  Surgical History Problems  1. History of Cystoscopy With Insertion Of Ureteral Stent Bilateral 2. History of Cystoscopy With Insertion Of Ureteral Stent Right 3. History of Cystoscopy With Insertion Of Ureteral Stent Right 4. History of Cystoscopy With Insertion Of Ureteral Stent Right 5. History of Wrist Surgery  Current Meds 1. Carvedilol 6.25 MG Oral Tablet;  Therapy: (Recorded:24Sep2015) to Recorded 2. FentaNYL 50 MCG/HR Transdermal Patch 72 Hour;  Therapy: (Recorded:24Sep2015) to Recorded 3. Hydrochlorothiazide 25 MG Oral Tablet;  Therapy: (Recorded:04Aug2015) to Recorded 4. Hydrocodone-Acetaminophen 5-325 MG Oral Tablet;  Therapy: (Recorded:24Sep2015) to Recorded 5. Jevtana 60 MG/1.5ML Intravenous Solution;  Therapy: (Recorded:24Sep2015) to Recorded 6. Ondansetron HCl - 4 MG Oral Tablet;  Therapy: (Recorded:24Sep2015) to Recorded 7. PredniSONE 10 MG Oral Tablet;  Therapy: (Recorded:24Sep2015) to Recorded  Allergies Medication  1. No Known Drug Allergies Non-Medication  2. Contrast Dye  Family History Problems  1. Family history of Acute Myocardial Infarction (V17.3) 2. Family history of Breast Cancer (V16.3) 3. Family history of Cancer 4. No pertinent family history : Mother 5. Family history of Prostate Cancer (V16.42)  Social History Problems  1. Denied: History of Alcohol Use 2. Caffeine Use 3. Current  every day smoker (305.1)   1/2ppd x 10 4. 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    History of Present Illness This is a 63 year old patient seen in follow up for evaluation of prostate cancer.   Date of diagnosis: 11/12   Type of treatment:  Androgen Deprivation, Leuprolide acetate   Pretreatment PSA: 59.66   Gleason score: 5+4=9                  He initially underwent a CT scan and bone scan which he reports were negative. He was started on [facility] agonist therapy indicating he likely had locally advanced disease. A CT scan done on 09/24/11 revealed diffuse retroperitoneal and pelvic adenopathy with focal sclerosis at T10 and L4 consistent with bony metastatic disease as well as a lobulated mass in the pelvis consistent with local extension. He has been under the care of an oncologist in [place], Dr. Jeb Levering, who had maintained him on Lupron. His PSA nadar in 7/13 was 5.1. In 8/13 it had increased to 7.3. I started Casodex during his hospitalization. He has since been placed on Zytiga and prednisone.   Right hydronephrosis: He was seen in the hospital in 8/13 do to an elevated creatinine of 3.5. He had some mild renal insufficiency with a baseline creatinine of approximately 1.4. He was found to have right hydronephrosis with some mild left renal atrophy and therefore had a percutaneous nephrostomy tube placed which was eventually internalized to a double-J stent. This resulted in normalization of his creatinine back down to 1.32 in 9/13. 1/14 - Rt. stent change   Interval history: Urologically he's been doing pretty well. He said he occasionally has a slight bit of blood in his urine. No gross clots or significant bleeding. He's not having any pain from his stent. He has a stent that I replaced in his right ureter. A percutaneous nephrostomy tube was placed in his left kidney and a nephrostogram revealed  obstruction at the level of the bladder likely from invasive prostate cancer. An attempt to gain access to the bladder through this area was unsuccessful initially. He scheduled to return on 01/13/13 for another attempt at placing a double-J stent for internalization of his nephrostomy tube on the left.      Past Medical History Problems   1. History of  Coronary Artery Disease V12.59 2. History of  Diabetes Mellitus 250.00 3. History of  Hypertension 401.9   Surgical History Problems   1. History of  Cystoscopy With Insertion Of Ureteral Stent Right 2. History of  Wrist Surgery Right   Current Meds 1. AmLODIPine Besylate 10 MG Oral Tablet; Therapy: [id number] to   Allergies Medication   1. No Known Drug Allergies   Family History Problems   1. Family history of  Acute Myocardial Infarction V17.3 2. Family history of  Breast Cancer V16.3 3. Family history of  Cancer 4. Family history of  Prostate Cancer V16.42   Social History Problems   1. Caffeine Use 2. Current Smoker 305.1 1/2ppd x 10 3. Marital History - Currently Married Denied   4. History of  Alcohol Use   Review of Systems Genitourinary, constitutional, skin, eye, otolaryngeal, hematologic/lymphatic, cardiovascular, pulmonary, endocrine, musculoskeletal, gastrointestinal, neurological and  psychiatric system(s) were reviewed and pertinent findings if present are noted.   Gastrointestinal: no flank pain and no abdominal pain.   Constitutional: no recent weight loss.   Musculoskeletal: no bone pain.      Physical Exam Constitutional: Well nourished and well developed with hair loss. No acute distress.   ENT:. The ears and nose are normal in appearance.   Neck: The appearance of the neck is normal and no neck mass is present.   Pulmonary: No respiratory distress and normal respiratory rhythm and effort.   Cardiovascular: Heart rate and rhythm are normal . No peripheral edema.   Abdomen: The abdomen is  soft and nontender. No masses are palpated. No CVA tenderness. No hernias are palpable. No hepatosplenomegaly noted.   Genitourinary: Examination of the penis demonstrates normal phallus, no discharge, no masses, no lesions and a normal meatus. The scrotum is without lesions. The right epididymis is palpably normal and non-tender. The left epididymis is palpably normal and non-tender. The right testis is non-tender and without masses. The left testis is non-tender and without masses.   Lymphatics: The femoral and inguinal nodes are not enlarged or tender.   Skin: Normal skin turgor, no visible rash and no visible skin lesions.   Neuro/Psych:. Mood and affect are appropriate.    Results/Data  The following images/tracing/specimen were independently visualized:  Renal ultrasound: The right kidney measured 13.8 cm with 14 mm of parenchyma and the left kidney measured 12.1 cm with 9 mm of parenchyma. Both kidneys have normal cortical echogenicity. Bilateral renal cysts were identified there appears simple. There is bilateral moderate to severe hydronephrosis that is worse when compared to his previous ultrasound. The stents appear to be present and in good position. There is obvious prostate cancer progression seen in the bladder at the base/bladder neck region.      Assessment  He's not having any pain but he is having hematuria. I told him that that was likely from the progression of his prostate cancer. He has bilateral hydronephrosis which when compared to his previous renal ultrasound has worsened. We discussed the fact that his worsening hydronephrosis indicates that his stents are likely occluded and need to be changed. If that is the case his creatinine will improve quickly however I told him that he may have some degree of renal disease present and the changing of his stents may not result in significant change in his creatinine although I think there is a high probability that this will result  in improvement of his creatinine. In addition I discussed with him the fact that when I perform his stent change I will check to see if I can find any areas that are actively bleeding that could be fulgurated. He is in agreement with this plan.   Plan   He will be scheduled for a stent change at 7:30.

## 2013-10-26 NOTE — Transfer of Care (Signed)
Immediate Anesthesia Transfer of Care Note  Patient: Francisco Dunn  Procedure(s) Performed: Procedure(s): BILATERAL STENT REPLACEMENT (Bilateral)  Patient Location: PACU  Anesthesia Type:General  Level of Consciousness: awake, sedated and patient cooperative  Airway & Oxygen Therapy: Patient Spontanous Breathing and Patient connected to face mask oxygen  Post-op Assessment: Report given to PACU RN and Post -op Vital signs reviewed and stable  Post vital signs: Reviewed and stable  Complications: No apparent anesthesia complications

## 2013-10-26 NOTE — Anesthesia Postprocedure Evaluation (Signed)
  Anesthesia Post-op Note  Patient: Francisco Dunn  Procedure(s) Performed: Procedure(s) (LRB): BILATERAL STENT REPLACEMENT (Bilateral)  Patient Location: PACU  Anesthesia Type: General  Level of Consciousness: awake and alert   Airway and Oxygen Therapy: Patient Spontanous Breathing  Post-op Pain: mild  Post-op Assessment: Post-op Vital signs reviewed, Patient's Cardiovascular Status Stable, Respiratory Function Stable, Patent Airway and No signs of Nausea or vomiting  Last Vitals:  Filed Vitals:   10/26/13 1006  BP: 131/66  Pulse: 78  Temp: 36.5 C  Resp:     Post-op Vital Signs: stable   Complications: No apparent anesthesia complications

## 2013-10-26 NOTE — Op Note (Signed)
PATIENT:  Francisco Dunn  PRE-OPERATIVE DIAGNOSIS: 1. Bilateral malignant ureteral obstruction  2. Bilateral progressive hydronephrosis. 3. Elevated creatinine  POST-OPERATIVE DIAGNOSIS: Same  PROCEDURE: 1.Cystoscopy with bilateral retrograde pyelograms including interpretation. 2. Dilation of malignant ureteral strictures bilaterally 3. Bilateral double-J stent removal 4. Bilateral double-J stent placement  SURGEON:  Claybon Jabs  INDICATION: Francisco Dunn is a 63 year old male with metastatic and locally advanced adenocarcinoma of the prostate. He developed right ureteral obstruction and then eventually left ureteral obstruction and has been managed with bilateral double-J stent. When he was seen in the office recently he was noted to have no increase in his hydronephrosis and a stable creatinine and we therefore discussed proceeding with stent placement in the future however his creatinine has increased to greater than 6 with ultrasonographic evidence of increased hydronephrosis bilaterally. We therefore discussed removal and replacement of the stents today.  ANESTHESIA:  General  EBL:  Minimal  DRAINS: Bilateral 6 French, 26 cm Bard [place] stents.  LOCAL MEDICATIONS USED:  None  SPECIMEN:  None  Description of procedure: After informed consent the patient was taken to the operating room and placed on the table in a supine position. General anesthesia was then administered. Once fully anesthetized the patient was moved to the dorsal lithotomy position and the genitalia were sterilely prepped and draped in standard fashion. An official timeout was then performed.  I first passed a 95 French cystoscope down the urethra using the 12 lens and noted the urethra to be normal. The prostatic urethra was noted to be fairly normal in appearance with bilateral hypertrophy however the bladder neck and trigonal region were completely unrecognizable due to the presence of friable tissue. I noted  no active bleeding initially.   I was first able to identify his right ureteral stent and grasped it with alligator graspers and withdrew it out through the urethra to the meatus where a 0.038 inch floppy-tipped guidewire was then passed into the area the renal pelvis under direct fluoroscopy. I then passed a 5 [place] open-ended ureteral catheter over the guidewire into the area the renal pelvis and removed the guidewire.  A right retrograde pyelogram was then performed in the standard fashion by injecting full-strength contrast through the open-ended ureteral catheter and he was noted to have marked hydronephrosis on the right-hand side with blunting of the calyces. Of note when I did pass the open-ended catheter into the area the renal pelvis and removed the guidewire he had an active hydronephrotic drip consistent with obstruction. The urine was clear. I therefore passed a guidewire through the open ended catheter back into the area the renal pelvis under fluoroscopy and left this in place after removing the open-ended catheter. I secured this to the drape and then proceeded to grasp the left ureteral stent and withdrew to the urethral meatus.  A left retrograde pyelogram was performed in an identical fashion. I noted less hydronephrosis on the left-hand side but a hydronephrotic drip was also noted when I placed the stent in the area the renal pelvis. I again left the guidewire in place and removed the open-ended catheter. I first tried to pass the stent over the guidewire after back loading it in the cystoscope but was unable to advance the stent up the ureter do to malignant obstruction at the intramural ureter region. With the guidewire in place I used a ureteral dilating balloon to dilate the intramural ureter and again tried to pass the stent that was unsuccessful do to  inability to pass the stent through this area. I therefore chose a ureteral access sheath and first passed the inner portion of a  ureteral access sheath, with much difficulty, through the area of stricture and up the ureter. I then removed this and assembled the access sheath and then, once again with a great deal of difficulty, was able to advance this up the ureter beyond the area of obstruction/stricture and left this in place. With the guidewire passing through the access sheath I was able to pass the stent in the area the renal pelvis over the guidewire and then noted good curl in the area the renal pelvis when I removed the guidewire and in the bladder under fluoroscopy.  I then proceeded to address the right ureter in an identical fashion the use of a ureteral access sheath in order to aid passage of the stent since an initial attempt at passing the stent through the malignant stricture in the distal ureter was again unsuccessful on this side. I was able to pass the stent into the area the renal pelvis through the access sheath successfully. Good curl was noted in the renal pelvis and bladder and the bladder was then irrigated because of the development of some bleeding and clots. I therefore elected to place a Foley catheter.  It is clear that in the future if drainage of his kidneys is necessary stents will likely not be the best option and there is a possibility that due to the significant tightness of the strictures he may not be able to go as long with the stents before having obstruction again and may be best served with percutaneous nephrostomy tubes.   PLAN OF CARE: Discharge to home after PACU  PATIENT DISPOSITION:  PACU - hemodynamically stable.

## 2013-10-26 NOTE — Discharge Instructions (Signed)

## 2013-10-26 NOTE — Anesthesia Preprocedure Evaluation (Signed)
Anesthesia Evaluation  Patient identified by MRN, date of birth, ID band Patient awake    Reviewed: Allergy & Precautions, H&P , NPO status , Patient's Chart, lab work & pertinent test results  Airway Mallampati: II TM Distance: >3 FB Neck ROM: full    Dental  (+) Caps, Dental Advisory Given,  Cap right upper front:   Pulmonary former smoker,  breath sounds clear to auscultation  Pulmonary exam normal       Cardiovascular Exercise Tolerance: Good hypertension, Pt. on home beta blockers and Pt. on medications + CAD and + Past MI Rhythm:regular Rate:Normal  03/2012 Cath showed occluded mid left circumflex and minor irregularities in the LAD and RCA.  EF 55%   Neuro/Psych Spinal mets for prostate CA negative psych ROS   GI/Hepatic negative GI ROS, Neg liver ROS,   Endo/Other  diabetes, Well Controlled, Type 2, Oral Hypoglycemic Agents  Renal/GU Renal diseasenegative Renal ROS   Advanced prostate CA with mets to liver and spine    Musculoskeletal   Abdominal   Peds  Hematology negative hematology ROS (+)   Anesthesia Other Findings   Reproductive/Obstetrics negative OB ROS                           Anesthesia Physical  Anesthesia Plan  ASA: III  Anesthesia Plan: General   Post-op Pain Management:    Induction: Intravenous  Airway Management Planned: LMA  Additional Equipment:   Intra-op Plan:   Post-operative Plan: Extubation in OR  Informed Consent: I have reviewed the patients History and Physical, chart, labs and discussed the procedure including the risks, benefits and alternatives for the proposed anesthesia with the patient or authorized representative who has indicated his/her understanding and acceptance.   Dental advisory given  Plan Discussed with: CRNA  Anesthesia Plan Comments:         Anesthesia Quick Evaluation

## 2013-10-29 ENCOUNTER — Other Ambulatory Visit (HOSPITAL_COMMUNITY): Payer: Self-pay | Admitting: Urology

## 2013-10-29 ENCOUNTER — Ambulatory Visit (INDEPENDENT_AMBULATORY_CARE_PROVIDER_SITE_OTHER): Payer: Managed Care, Other (non HMO) | Admitting: Cardiovascular Disease

## 2013-10-29 ENCOUNTER — Encounter: Payer: Self-pay | Admitting: Cardiovascular Disease

## 2013-10-29 VITALS — BP 132/83 | HR 89 | Ht 72.0 in | Wt 170.5 lb

## 2013-10-29 DIAGNOSIS — R310 Gross hematuria: Secondary | ICD-10-CM

## 2013-10-29 DIAGNOSIS — E782 Mixed hyperlipidemia: Secondary | ICD-10-CM

## 2013-10-29 DIAGNOSIS — I10 Essential (primary) hypertension: Secondary | ICD-10-CM

## 2013-10-29 DIAGNOSIS — I2510 Atherosclerotic heart disease of native coronary artery without angina pectoris: Secondary | ICD-10-CM

## 2013-10-29 NOTE — Assessment & Plan Note (Signed)
Blood pressure is well controlled on carvedilol.

## 2013-10-29 NOTE — Patient Instructions (Signed)
Continue same medications.  Follow up in 6 months.  

## 2013-10-29 NOTE — Assessment & Plan Note (Signed)
He is stable from a cardiac standpoint with no symptoms suggestive of angina. Continue medical therapy. Unfortunately, his metastatic prostate cancer continues to worsen.

## 2013-10-29 NOTE — Progress Notes (Signed)
Primary care physician: Dr. Danise Mina  HPI  Francisco Dunn is a very pleasant 63 year old gentleman who is here today for followup visit regarding coronary artery disease. He has a history of metastatic prostate cancer, hypertension and hyperlipidemia. He was seen in 03/2012 for substernal chest tightness. He was noted to have an abnormal EKG with inferior Q waves. He underwent a nuclear stress test which showed small area of ischemia in the inferolateral wall with borderline reduced LV systolic function. He underwent cardiac catheterization  which showed minor irregularities in the LAD and RCA. The left circumflex was chronically occluded. He was treated medically.  He has metastatic prostate cancer and still undergoing chemotherapy. This unfortunately continues to worsen. He developed worsening renal failure due to obstructive nephropathy. He continues to be stable from a cardiac standpoint and denies chest pain or significant shortness of breath. He complains of generalized fatigue.   Allergies  Allergen Reactions  . Contrast Media [Iodinated Diagnostic Agents] Nausea And Vomiting    Felt unwell 2 hours post dye with nausea and general feeling unwell  . Lisinopril Other (See Comments)    creatinine elevation 1.4->2.0     Current Outpatient Prescriptions on File Prior to Visit  Medication Sig Dispense Refill  . aspirin 81 MG tablet Take 81 mg by mouth daily.      Marland Kitchen CABAZITAXEL IV Inject 1 each into the vein every 21 ( twenty-one) days.       . carvedilol (COREG) 6.25 MG tablet Take 1 tablet (6.25 mg total) by mouth 2 (two) times daily.  60 tablet  6  . docusate sodium (COLACE) 100 MG capsule Take 100 mg by mouth daily as needed for mild constipation.      . fentaNYL (DURAGESIC - DOSED MCG/HR) 50 MCG/HR Place 50 mcg onto the skin every 3 (three) days.      Marland Kitchen HYDROcodone-acetaminophen (NORCO/VICODIN) 5-325 MG per tablet Take 1-2 tablets by mouth every 6 (six) hours as needed for moderate pain.       . Multiple Vitamin (MULTIVITAMIN) tablet Take 1 tablet by mouth daily.       Marland Kitchen oxyCODONE-acetaminophen (PERCOCET) 10-325 MG per tablet Take 1-2 tablets by mouth every 4 (four) hours as needed for pain. Maximum dose per 24 hours - 8 pills  30 tablet  0  . phenazopyridine (PYRIDIUM) 200 MG tablet Take 1 tablet (200 mg total) by mouth 3 (three) times daily as needed for pain.  30 tablet  0  . predniSONE (DELTASONE) 10 MG tablet Take 10 mg by mouth 2 (two) times daily with a meal.       No current facility-administered medications on file prior to visit.     Past Medical History  Diagnosis Date  . Diabetes type 2, controlled   . Mixed hyperlipidemia   . 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 Len Blalock (onc), added casodex, then on abiaterone+prednisone now on cabazitaxel+prednisone (Ottelin)   . Hypertension   . Bilateral ureteral obstruction     SECONDARY TO METS FROM PROSTATE CANCER---[place]    BILATERAL URETERAL STENT--    . Frequency of urination   . Urgency of urination   . Wears glasses   . Bruises easily   . Coronary artery disease 03/2012    CARDIOLOGIST-  DR Kathlyn [place]     Past Surgical History  Procedure Laterality Date  . Tonsillectomy  1960's  .  Wisdom tooth extraction  1968  . Carpal tunnel release Right 10/12/06  . Cystoscopy w/ ureteral stent placement  02/11/2012    Procedure: CYSTOSCOPY WITH RETROGRADE PYELOGRAM/URETERAL STENT PLACEMENT;  Surgeon: Claybon Jabs, MD;  Location: [facility];  Service: Urology;  Laterality: Right;  NO URETEROSCOPY   . Cystoscopy w/ ureteral stent removal  02/11/2012    Procedure: CYSTOSCOPY WITH STENT REMOVAL;  Surgeon: Claybon Jabs, MD;  Location: [facility];  Service: Urology;  Laterality: Right;  . Cystoscopy w/ ureteral stent placement Right 07/14/2012    Procedure: RIGHT RETROGRADE PYELOGRAM/STENT  CHANGE;  Surgeon: Claybon Jabs, MD;  Location: [facility];  Service: Urology;  Laterality: Right;  . Cystoscopy with retrograde pyelogram, ureteroscopy and stent placement Bilateral 12/18/2012    Procedure: CYSTOSCOPY, RIGHT STENT EXCHANGE,;  Surgeon: Claybon Jabs, MD;  Location: [facility];  Service: Urology;  Laterality: Bilateral;  . Cardiovascular stress test  03-17-2012   [facility]    PERFUSTION STUDY WITH SMALL REGION OF ISCHEMIA IN THE INFEROLATERAL WALL FROM BASAL TO MID REGION/  EF 46%/   NO EKG CHANGES  . Cardiac catheterization  03-31-2012   [facility]    SUBTOTALLY OCCLUDED  mLCX,  mid to distal LCX FILLING LATE,  SMALL AMOUTH COLLATERALS/  MILD LUMENAL IRREGULARITIES IN  LAD & RCA/  EF 55%/  NO SIGNIFICANT AS or  MR/ ATTEMPTED PCI OF mLCX UNSUCCESSFUL  . Cystoscopy w/ ureteral stent placement Bilateral 06/18/2013    Procedure: CYSTOSCOPY WITH BILATERAL  STENT CHANGE;  Surgeon: Claybon Jabs, MD;  Location: [facility];  Service: Urology;  Laterality: Bilateral;     Family History  Problem Relation Age of Onset  . Coronary artery disease Mother     PTCA  . Healthy Father   . Thyroid disease Brother   . Heart attack Maternal Grandfather   . Heart attack Paternal Grandfather   . Heart attack Paternal Grandmother   . Heart attack Paternal Aunt   . Diabetes      Both sides  . Prostate cancer Neg Hx   . Colon cancer Neg Hx   . Breast cancer Neg Hx      History   Social History  . Marital Status: Married    Spouse Name: N/A    Number of Children: 86  . Years of Education: N/A   Occupational History  . Not on file.   Social History Main Topics  . Smoking status: Former Smoker -- 0.15 packs/day for 20 years    Types: Cigarettes    Quit date: 06/14/2003  . Smokeless tobacco: Never Used  . Alcohol Use: No  . Drug Use: No  . Sexual Activity: Not on file   Other Topics Concern  . 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  Has put in for early retirement.  May consider disability through work.     PHYSICAL EXAM   BP 132/83  Pulse 89  Ht 6' (1.829 m)  Wt 170 lb 8 oz (77.338 kg)  BMI 23.12 kg/m2 Constitutional: He is oriented to person, place, and time. He appears well-developed and well-nourished. No distress.  HENT: No nasal discharge.  Head: Normocephalic and atraumatic.  Eyes: Pupils are equal and round. Right eye exhibits no discharge. Left eye  exhibits no discharge.  Neck: Normal range of motion. Neck supple. No JVD present. No thyromegaly present.  Cardiovascular: Normal rate, regular rhythm, normal heart sounds and. Exam reveals no gallop and no friction rub. No murmur heard.  Pulmonary/Chest: Effort normal and breath sounds normal. No stridor. No respiratory distress. He has no wheezes. He has no rales. He exhibits no tenderness.  Abdominal: Soft. Bowel sounds are normal. He exhibits no distension. There is no tenderness. There is no rebound and no guarding.  Musculoskeletal: Normal range of motion. He exhibits no edema and no tenderness.  Neurological: He is alert and oriented to person, place, and time. Coordination normal.  Skin: Skin is warm and dry. No rash noted. He is not diaphoretic. No erythema. No pallor.  Psychiatric: He has a normal mood and affect. His behavior is normal. Judgment and thought content normal.      EKG: Sinus  Rhythm  Low voltage -possible pulmonary disease.   ABNORMAL    ASSESSMENT AND PLAN

## 2013-10-29 NOTE — Assessment & Plan Note (Signed)
He has been of atorvastatin since he started chemotherapy.

## 2013-10-30 ENCOUNTER — Encounter (HOSPITAL_COMMUNITY): Payer: Self-pay

## 2013-10-30 ENCOUNTER — Other Ambulatory Visit: Payer: Self-pay | Admitting: Radiology

## 2013-10-30 ENCOUNTER — Other Ambulatory Visit (HOSPITAL_COMMUNITY): Payer: Self-pay | Admitting: Urology

## 2013-10-30 ENCOUNTER — Ambulatory Visit (HOSPITAL_COMMUNITY)
Admission: RE | Admit: 2013-10-30 | Discharge: 2013-10-30 | Disposition: A | Discharge status: Home / Self Care | Payer: Managed Care, Other (non HMO) | Source: Ambulatory Visit | Attending: Urology | Admitting: Urology

## 2013-10-30 DIAGNOSIS — E119 Type 2 diabetes mellitus without complications: Secondary | ICD-10-CM | POA: Diagnosis not present

## 2013-10-30 DIAGNOSIS — Z87891 Personal history of nicotine dependence: Secondary | ICD-10-CM | POA: Insufficient documentation

## 2013-10-30 DIAGNOSIS — E782 Mixed hyperlipidemia: Secondary | ICD-10-CM | POA: Insufficient documentation

## 2013-10-30 DIAGNOSIS — IMO0002 Reserved for concepts with insufficient information to code with codable children: Secondary | ICD-10-CM | POA: Insufficient documentation

## 2013-10-30 DIAGNOSIS — I10 Essential (primary) hypertension: Secondary | ICD-10-CM | POA: Insufficient documentation

## 2013-10-30 DIAGNOSIS — C61 Malignant neoplasm of prostate: Secondary | ICD-10-CM | POA: Diagnosis not present

## 2013-10-30 DIAGNOSIS — N1330 Unspecified hydronephrosis: Secondary | ICD-10-CM | POA: Diagnosis present

## 2013-10-30 DIAGNOSIS — Z7982 Long term (current) use of aspirin: Secondary | ICD-10-CM | POA: Insufficient documentation

## 2013-10-30 DIAGNOSIS — I2510 Atherosclerotic heart disease of native coronary artery without angina pectoris: Secondary | ICD-10-CM | POA: Diagnosis not present

## 2013-10-30 DIAGNOSIS — N135 Crossing vessel and stricture of ureter without hydronephrosis: Secondary | ICD-10-CM | POA: Diagnosis not present

## 2013-10-30 DIAGNOSIS — R310 Gross hematuria: Secondary | ICD-10-CM

## 2013-10-30 DIAGNOSIS — C7952 Secondary malignant neoplasm of bone marrow: Secondary | ICD-10-CM

## 2013-10-30 DIAGNOSIS — C7951 Secondary malignant neoplasm of bone: Secondary | ICD-10-CM | POA: Insufficient documentation

## 2013-10-30 DIAGNOSIS — C787 Secondary malignant neoplasm of liver and intrahepatic bile duct: Secondary | ICD-10-CM | POA: Diagnosis not present

## 2013-10-30 LAB — BASIC METABOLIC PANEL
ANION GAP: 12 (ref 5–15)
BUN: 50 mg/dL — ABNORMAL HIGH (ref 6–23)
CALCIUM: 8.7 mg/dL (ref 8.4–10.5)
CO2: 26 meq/L (ref 19–32)
CREATININE: 5.28 mg/dL — AB (ref 0.50–1.35)
Chloride: 99 mEq/L (ref 96–112)
GFR, EST AFRICAN AMERICAN: 12 mL/min — AB (ref >90)
GFR, EST NON AFRICAN AMERICAN: 10 mL/min — AB (ref >90)
Glucose, Bld: 218 mg/dL — ABNORMAL HIGH (ref 70–99)
Potassium: 5.4 mEq/L — ABNORMAL HIGH (ref 3.7–5.3)
SODIUM: 137 meq/L (ref 137–147)

## 2013-10-30 LAB — CBC
HCT: 30.3 % — ABNORMAL LOW (ref 39.0–52.0)
Hemoglobin: 9.6 g/dL — ABNORMAL LOW (ref 13.0–17.0)
MCH: 27.7 pg (ref 26.0–34.0)
MCHC: 31.7 g/dL (ref 30.0–36.0)
MCV: 87.3 fL (ref 78.0–100.0)
PLATELETS: 442 10*3/uL — AB (ref 150–400)
RBC: 3.47 MIL/uL — ABNORMAL LOW (ref 4.22–5.81)
RDW: 19.4 % — ABNORMAL HIGH (ref 11.5–15.5)
WBC: 5.5 10*3/uL (ref 4.0–10.5)

## 2013-10-30 LAB — PROTIME-INR
INR: 1.05 (ref 0.00–1.49)
PROTHROMBIN TIME: 13.7 s (ref 11.6–15.2)

## 2013-10-30 LAB — APTT: APTT: 32 s (ref 24–37)

## 2013-10-30 MED ORDER — MORPHINE SULFATE 4 MG/ML IJ SOLN
INTRAMUSCULAR | Status: AC
  Filled 2013-10-30: qty 2

## 2013-10-30 MED ORDER — IOHEXOL 300 MG/ML  SOLN
50.0000 mL | Freq: Once | INTRAMUSCULAR | Status: AC | PRN
  Administered 2013-10-30: 20 mL

## 2013-10-30 MED ORDER — FENTANYL CITRATE 0.05 MG/ML IJ SOLN
INTRAMUSCULAR | Status: AC | PRN
  Administered 2013-10-30: 50 ug via INTRAVENOUS
  Administered 2013-10-30: 25 ug via INTRAVENOUS

## 2013-10-30 MED ORDER — CIPROFLOXACIN IN D5W 400 MG/200ML IV SOLN
400.0000 mg | Freq: Once | INTRAVENOUS | Status: AC
  Administered 2013-10-30: 400 mg via INTRAVENOUS

## 2013-10-30 MED ORDER — MIDAZOLAM HCL 2 MG/2ML IJ SOLN
INTRAMUSCULAR | Status: AC
  Filled 2013-10-30: qty 4

## 2013-10-30 MED ORDER — SODIUM CHLORIDE 0.9 % IV SOLN
INTRAVENOUS | Status: AC | PRN
  Administered 2013-10-30: 10 mL/h via INTRAVENOUS

## 2013-10-30 MED ORDER — SODIUM CHLORIDE 0.9 % IV SOLN: Freq: Once | INTRAVENOUS | Status: DC

## 2013-10-30 MED ORDER — DIPHENHYDRAMINE HCL 50 MG/ML IJ SOLN
INTRAMUSCULAR | Status: AC
  Filled 2013-10-30: qty 1

## 2013-10-30 MED ORDER — MIDAZOLAM HCL 2 MG/2ML IJ SOLN
INTRAMUSCULAR | Status: AC | PRN
  Administered 2013-10-30: 1 mg via INTRAVENOUS
  Administered 2013-10-30: 0.5 mg via INTRAVENOUS

## 2013-10-30 MED ORDER — HYDROCODONE-ACETAMINOPHEN 5-325 MG PO TABS: 1.0000 | ORAL_TABLET | ORAL | Status: DC | PRN

## 2013-10-30 MED ORDER — LIDOCAINE HCL 1 % IJ SOLN
INTRAMUSCULAR | Status: AC
  Filled 2013-10-30: qty 20

## 2013-10-30 MED ORDER — MORPHINE SULFATE 10 MG/ML IJ SOLN
INTRAMUSCULAR | Status: AC | PRN
  Administered 2013-10-30: 5 mg via INTRAVENOUS

## 2013-10-30 MED ORDER — DIPHENHYDRAMINE HCL 50 MG/ML IJ SOLN
INTRAMUSCULAR | Status: AC | PRN
  Administered 2013-10-30: 50 mg via INTRAVENOUS

## 2013-10-30 MED ORDER — FENTANYL CITRATE 0.05 MG/ML IJ SOLN
INTRAMUSCULAR | Status: AC
  Filled 2013-10-30: qty 4

## 2013-10-30 NOTE — Procedures (Signed)
Bilat perc neph placement No complication No blood loss. See complete dictation in [facility].

## 2013-10-30 NOTE — H&P (Signed)
Chief Complaint: B hydronephrosis and increasing creatinine  Referring Physician(s): Ottelin,Mark C  History of Present Illness: Francisco Dunn is a 63 y.o. male  Pt with known prostate cancer R PCN and JJ stent 10/2011 L PCN and JJ stent placed 12/2012 Followed by Dr Karsten Ro Noticed hematuria and increase in Cr Stent changes just 10/26/13 with Dr Karsten Ro- with difficulty per note--scarring and apparent stenosis If Cr continued to rise- He felt pt would need B Percutaneous nephrostomy tubes for drainage Pt now scheduled for same today. Hematuria has resolved   Past Medical History  Diagnosis Date  . Diabetes type 2, controlled   . Mixed hyperlipidemia   . 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 Len Blalock (onc), added casodex, then on abiaterone+prednisone now on cabazitaxel+prednisone (Ottelin)   . Hypertension   . Bilateral ureteral obstruction     SECONDARY TO METS FROM PROSTATE CANCER---[place]    BILATERAL URETERAL STENT--    . Frequency of urination   . Urgency of urination   . Wears glasses   . Bruises easily   . Coronary artery disease 03/2012    CARDIOLOGIST-  DR Kathlyn [place]    Past Surgical History  Procedure Laterality Date  . Tonsillectomy  1960's  . Wisdom tooth extraction  1968  . Carpal tunnel release Right 10/12/06  . Cystoscopy w/ ureteral stent placement  02/11/2012    Procedure: CYSTOSCOPY WITH RETROGRADE PYELOGRAM/URETERAL STENT PLACEMENT;  Surgeon: Claybon Jabs, MD;  Location: [facility];  Service: Urology;  Laterality: Right;  NO URETEROSCOPY   . Cystoscopy w/ ureteral stent removal  02/11/2012    Procedure: CYSTOSCOPY WITH STENT REMOVAL;  Surgeon: Claybon Jabs, MD;  Location: [facility];  Service: Urology;  Laterality: Right;  . Cystoscopy w/ ureteral stent placement Right 07/14/2012    Procedure: RIGHT RETROGRADE  PYELOGRAM/STENT CHANGE;  Surgeon: Claybon Jabs, MD;  Location: [facility];  Service: Urology;  Laterality: Right;  . Cystoscopy with retrograde pyelogram, ureteroscopy and stent placement Bilateral 12/18/2012    Procedure: CYSTOSCOPY, RIGHT STENT EXCHANGE,;  Surgeon: Claybon Jabs, MD;  Location: [facility];  Service: Urology;  Laterality: Bilateral;  . Cardiovascular stress test  03-17-2012   [facility]    PERFUSTION STUDY WITH SMALL REGION OF ISCHEMIA IN THE INFEROLATERAL WALL FROM BASAL TO MID REGION/  EF 46%/   NO EKG CHANGES  . Cardiac catheterization  03-31-2012   [facility]    SUBTOTALLY OCCLUDED  mLCX,  mid to distal LCX FILLING LATE,  SMALL AMOUTH COLLATERALS/  MILD LUMENAL IRREGULARITIES IN  LAD & RCA/  EF 55%/  NO SIGNIFICANT AS or  MR/ ATTEMPTED PCI OF mLCX UNSUCCESSFUL  . Cystoscopy w/ ureteral stent placement Bilateral 06/18/2013    Procedure: CYSTOSCOPY WITH BILATERAL  STENT CHANGE;  Surgeon: Claybon Jabs, MD;  [facility];  Service: Urology;  Laterality: Bilateral;  . Cystoscopy with ureteroscopy and stent placement Bilateral 10/26/2013    Procedure: BILATERAL STENT REPLACEMENT;  Surgeon: Claybon Jabs, MD;  Location: [facility];  Service: Urology;  Laterality: Bilateral;    Allergies: Contrast media and Lisinopril  Medications: Prior to Admission medications   Medication Sig Start Date End Date Taking? Authorizing Provider  aspirin 81 MG tablet Take 81 mg by mouth daily.    Historical Provider, MD  CABAZITAXEL IV Inject 1 each  into the vein every 21 ( twenty-one) days.     Historical Provider, MD  carvedilol (COREG) 6.25 MG tablet Take 1 tablet (6.25 mg total) by mouth 2 (two) times daily. 09/12/13   Ria Bush, MD  docusate sodium (COLACE) 100 MG capsule Take 100 mg by mouth daily as needed for mild constipation.    Historical Provider, MD  fentaNYL (DURAGESIC - DOSED MCG/HR) 50 MCG/HR Place 50 mcg onto the skin every 3 (three) days.     Historical Provider, MD  HYDROcodone-acetaminophen (NORCO/VICODIN) 5-325 MG per tablet Take 1-2 tablets by mouth every 6 (six) hours as needed for moderate pain.    Historical Provider, MD  Multiple Vitamin (MULTIVITAMIN) tablet Take 1 tablet by mouth daily.     Historical Provider, MD  oxyCODONE-acetaminophen (PERCOCET) 10-325 MG per tablet Take 1-2 tablets by mouth every 4 (four) hours as needed for pain. Maximum dose per 24 hours - 8 pills 10/26/13   Claybon Jabs, MD  phenazopyridine (PYRIDIUM) 200 MG tablet Take 1 tablet (200 mg total) by mouth 3 (three) times daily as needed for pain. 10/26/13   Claybon Jabs, MD  predniSONE (DELTASONE) 10 MG tablet Take 10 mg by mouth 2 (two) times daily with a meal.    Historical Provider, MD    Family History  Problem Relation Age of Onset  . Coronary artery disease Mother     PTCA  . Healthy Father   . Thyroid disease Brother   . Heart attack Maternal Grandfather   . Heart attack Paternal Grandfather   . Heart attack Paternal Grandmother   . Heart attack Paternal Aunt   . Diabetes      Both sides  . Prostate cancer Neg Hx   . Colon cancer Neg Hx   . Breast cancer Neg Hx     History   Social History  . Marital Status: Married    Spouse Name: N/A    Number of Children: 5  . Years of Education: N/A   Social History Main Topics  . Smoking status: Former Smoker -- 0.15 packs/day for 20 years    Types: Cigarettes    Quit date: 06/14/2003  . Smokeless tobacco: Never Used  . Alcohol Use: No  . Drug Use: No  . Sexual Activity: None   Other Topics Concern  . 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  May consider disability through work.    Review of Systems: A 12 point ROS discussed and pertinent positives are indicated in the HPI above.  All other  systems are negative.  Review of Systems  Constitutional: Negative for fever, activity change and appetite change.  Respiratory: Negative for cough, chest tightness and shortness of breath.   Cardiovascular: Negative for chest pain.  Gastrointestinal: Positive for abdominal pain. Negative for nausea and vomiting.  Genitourinary: Positive for genital sores.       Has catheter in place  Musculoskeletal: Positive for back pain.  Psychiatric/Behavioral: Negative for behavioral problems and confusion.    Vital Signs: BP 136/75  Pulse 81  Temp(Src) 97.8 F (36.6 C) (Oral)  Resp 18  Ht 6' (1.829 m)  Wt 77.111 kg (170 lb)  BMI 23.05 kg/m2  SpO2 97%  Physical Exam  Constitutional: He is oriented to person, place, and  time. He appears well-nourished.  Cardiovascular: Normal rate, regular rhythm and normal heart sounds.   Pulmonary/Chest: Effort normal and breath sounds normal. He has no wheezes.  Abdominal: Soft. Bowel sounds are normal. There is no tenderness.  Musculoskeletal: Normal range of motion.  Neurological: He is alert and oriented to person, place, and time.  Skin: Skin is warm and dry.  Psychiatric: He has a normal mood and affect. His behavior is normal. Judgment and thought content normal.    Imaging: Dg Chest 2 View  10/26/2013   CLINICAL DATA:  Diabetes. History of prostate cancer and hypertension.  EXAM: CHEST  2 VIEW  COMPARISON:  None.  FINDINGS: Normal cardiac silhouette. There is abnormal nodularity of the left paratracheal stripe as well as the descending thoracic aorta. The lungs appear mildly hyperexpanded. There is mild diffuse slightly nodular thickening of the pulmonary interstitium. No focal airspace opacities. No pleural effusion or pneumothorax. No evidence of edema. No acute osseus abnormalities.  IMPRESSION: 1.  No acute cardiopulmonary disease. 2. Abnormal nodularity of the left paratracheal stripe as about the descending thoracic aorta - while possibly  secondary to tortuous vasculature, adenopathy could have a similar appearance. Comparison with prior outside examinations (if available) is recommended. Otherwise, further evaluation with contrast enhanced chest CT is recommended. These results will be called to the ordering clinician or representative by the Radiologist Assistant, and communication documented in the PACS or zVision Dashboard.   Electronically Signed   By: Sandi Mariscal M.D.   On: 10/26/2013 08:18    Labs: Lab Results  Component Value Date   WBC 5.5 10/30/2013   HCT 30.3* 10/30/2013   MCV 87.3 10/30/2013   PLT 442* 10/30/2013   NA 137 10/30/2013   K 5.4* 10/30/2013   CL 99 10/30/2013   CO2 26 10/30/2013   GLUCOSE 218* 10/30/2013   BUN 50* 10/30/2013   CREATININE 5.28* 10/30/2013   CALCIUM 8.7 10/30/2013   PROT 7.5 09/23/2011   ALBUMIN 3.2* 09/12/2013   AST 26 10/24/2013   ALT 13 10/24/2013   ALKPHOS 81 09/23/2011   BILITOT 0.3 09/23/2011   GFRNONAA 10* 10/30/2013   GFRAA 12* 10/30/2013   INR 1.05 10/30/2013    Assessment and Plan:  Hx prostate cancer Follows with Dr Karsten Ro Noted hematuria and increased Cr Existing JJ stents changed Fri 9/25 + difficulty: scarring of ureters Still with rising Creatinine (5.28 today) Scheduled now for B percutaneous nephrostomy tube placement Pt aware of procedure benefits and risks and agreeable to proceed Consent signed and in chart    Thank you for this interesting consult.  I greatly enjoyed meeting Francisco Dunn and look forward to participating in their care.   I spent a total of 20 minutes face to face in clinical consultation, greater than 50% of which was counseling/coordinating care for B PCNs  Signed: Jeramiah Mccaughey A 10/30/2013, 8:54 AM

## 2013-10-30 NOTE — Sedation Documentation (Signed)
Patient is resting comfortably in nursing observation area. Awaiting bed in short stay. VSS, without c/o pain, SOB, or N/V at this time. B PCN dressings C/D/I, draining to gravity.

## 2013-10-30 NOTE — Discharge Instructions (Signed)
Percutaneous Nephrostomy Home Guide  A nephrostomy tube allows urine to leave your body when a medical condition prevents it from leaving your kidney normally. Urine is normally carried from the kidneys to the bladder through narrow tubes called ureters. The ureter can become obstructed due to conditions such as kidney stones, tumors, infection, or blood clots. A nephrostomy tube is a hollow, flexible tube placed into the kidney to restore the flow of urine. The tube is placed on the right or left side of your lower back and is connected to an external drainage bag.  PERSONAL HYGIENE  · You may shower unless otherwise told by your health care provider. Prepare for a shower by placing a plastic covering over the nephrostomy tube dressing.  · Change the dressing immediately after showering. Make sure your skin around the nephrostomy tube exit site is dry.  · Avoid immersing your nephrostomy tube in water, such as taking a bath or swimming.  NEPHROSTOMY TUBE CARE  · Your nephrostomy tube is connected to a leg bag or bedside drainage bag. Always keep your tubing, leg bag, or bedside drainage bag below the level of your kidney so that your urine drains freely.  · Your activity is not restricted as long as your activity does not pull or tug on your tube.  · During the day, if you are connecting your nephrostomy tube to a leg bag, ensure that your tubing does not have any kinks and that your urine is draining freely. One helpful technique to prevent kinking or inadvertent dislodging of your nephrostomy tubing is to gently wrap an elastic bandage over the tubing. This will help to secure the tubing in place. Make sure there is no tension on the tubing so it does not become dislodged.  · At night, you may want to connect your nephrostomy tube to a larger bedside drainage bag.  EMPTYING THE LEG BAG OR BEDSIDE DRAINAGE BAG   The leg bag or bedside drainage bag should be emptied when it becomes  full and before going to sleep.  Most leg bags and bedside drainage bags have a drain at the bottom that allows urine to be emptied.  1. Hold the leg bag or bedside drainage bag over a toilet or collection container. Use a measuring container if you are directed to measure your urine.  2. Open the drain and allow the urine to drain.  3. Once all the urine is drained from the leg bag or bedside drainage bag, close the drain fully to avoid urine leakage.  4. Flush urine down toilet. If a collection container was used, rinse the container.  NEPHROSTOMY TUBE EXIT SITE CARE  The exit site for your nephrostomy tube is covered with a bandage (dressing). Clean your exit site and change your dressing as directed by your health care provider or if your dressing becomes wet.  Supplies Needed:  · Mild soap and water.  · 4×4 inch (10x10 cm) split gauze pads.  · 4×4 inch (10x10 cm) gauze pads.  · Paper tape.  Exit Site Care and Dressing Change:For the first 2 weeks after having a nephrostomy tube inserted, you should change the dressing every day. After 2 weeks, you can change the dressing 2 times per week, whenever the dressing becomes wet, or as told by your health care provider. Because of the location of your nephrostomy tube, you may need help from another person to complete dressing changes. The steps to changing a dressing are:  1. Wash hands   well with soap and water.  2. Gently remove the tapes and dressing from around the nephrostomy tube. Be careful not to pull on the tube while removing the dressing. Avoid using scissors to remove the dressing since this may lead to accidental damage to the tube.  3. Wash the skin around the tube with the mild soap and water, rinse well, and dry with a clean cloth.  4. Inspect the skin around the drain for redness, swelling, and foul-smelling yellow or green discharge.  5. If the drain was sutured to the skin, inspect the suture to verify that it is still anchored in the skin.  6. Place two split gauze pads in and  around the tube exit site. Do not apply ointments or alcohol to the site.  7. Place a gauze pad on top of the split gauze pad.  8. Coil the tube on top of the gauze. The tubing should rest on the gauze, not on the skin.  9. Place tape around each edge of the gauze pad.  10. Secure the nephrostomy tubing. Ensure that the nephrostomy tube does not kink or become pinched. The tubing should rest on the gauze pad and not on the skin.  11. Dispose of used supplies properly.  FLUSHING YOUR NEPHROSTOMY TUBE   Flush your nephrostomy tube as directed by your health care provider. Flushing of a nephrostomy tube is easier if a three-way stopcock is placed between the tube and the leg bag or bedside drainage bag. One connection of the three-way stopcock connects to your tube, the second connects to the leg bag or bedside drainage bag, and the third connection is usually covered with a cap. The three-way stopcock lever points to the direction on the stopcock that is closed to flow. Normally, the lever points in the direction of the cap to allow urine to drain from the tube to the leg bag or bedside drainage bag.  Supplies Needed:  · Rubbing alcohol wipe.  · 10 mL 0.9% saline syringe.  Flushing Your Nephrostomy Tube  1. Gather needed supplies.  2. Move the lever of the three-way stopcock so that it points toward the leg bag or bedside drainage bag.  3. Clean the cap with a rubbing alcohol wipe and then screw the tip of a 10 mL 0.9% saline syringe onto the cap.  4. Using the syringe plunger, slowly push the 10 mL 0.9% saline in the syringe over 5-10 seconds. If resistance is met or pain occurs while pushing, stop pushing the saline immediately.  5. Remove the syringe from the cap.  6. Return the stopcock lever to the usual position which is pointing in the direction of the cap.  7. Dispose of used supplies properly.  REPLACING YOUR LEG BAG OR BEDSIDE DRAINAGE BAG   Replace your leg bag or bedside drainage bag, three-way stopcock,  and any extension tubing as directed by your health care provider. Make sure you always have an extra drainage bag and connecting tubing available.  1. Empty urine from your leg bag or bedside drainage bag.  2. Gather new leg bag or bedside drainage bag, three-way stopcock, and any extension tubing.  3. Remove the leg bag or bedside drainage bag, three-way stopcock, and any extension tubing from the nephrostomy tube.  4. Attach the new leg bag or bedside drainage bag, three-way stopcock, and any extension tubing to the nephrostomy tube.  5. Dispose of the used leg bag or bedside drainage bag, three-way stopcock, and any extension   difficulty or pain with flushing the tube.  You notice a decrease in your urine output not explained by drinking less fluids.  Your nephrostomy tube comes out or the suture securing the tube comes free. Document Released: 11/08/2012 Document Revised: 01/23/2013 Document Reviewed: 11/08/2012 [facility] Patient Information 2015 [place], [place]. This information is not intended to replace advice given to you by your health care provider. Make sure you discuss any questions you have with your health care provider. Percutaneous Nephrostomy, Care After Refer to this sheet in the next few weeks. These instructions provide you with information on caring for yourself after your procedure. Your health care provider may also give you more specific instructions. Your treatment has been planned according to current medical practices, but problems sometimes occur. Call your health care provider if you have any problems or questions after your procedure. WHAT TO EXPECT AFTER THE  PROCEDURE You will need to remain lying down for several hours. HOME CARE INSTRUCTIONS  Your nephrostomy tube is connected to a leg bag or bedside drainage bag. Always keep the tubing, the leg bag, or the bedside drainage bags below the level of the kidney so that the urine drains freely.  During the day, if you are connecting the nephrostomy tube to a leg bag, be sure there are no kinks in the tubing and that the urine is draining freely.  At night, you may want to connect the nephrostomy tube or the leg bag to a larger bedside drainage bag.  Change the dressing as often as directed by your health care provider, or if it becomes wet.  Gently remove the tapes and dressing from around the nephrostomy tube. Be careful not to pull on the tube while removing the dressing.  Wash the skin around the tube, rinse well, and dry.  Place two split drain sponges in and around the tube exit site.  Place tape around edge of the dressing.  Secure the nephrostomy tubing. Remember to make certain that the nephrostomy tube does not kink or become pinched closed. It can be useful to wrap any exposed tubing going from the nephrostomy tube to any of the connecting tubes to either the leg bag or drainage bag with an elastic bandage.  Every three weeks, replace the leg bag, drainage bag, and any extension tubing connected to your nephrostomy tube. Your health care provider will explain how to change the drainage bag and extension tubing. SEEK MEDICAL CARE IF:  You experience any problems with any of the valves or tubing.  You have persistent pain or soreness in your back.  You have a fever or chills. SEEK IMMEDIATE MEDICAL CARE IF:  You have abdominal pain during the first week.  You have a new appearance of blood in your urine.  You have back pain that is not relieved by your medicine.  You have drainage, redness, swelling, or pain at the tube insertion site.  You have decreased urine  output.  Your nephrostomy tube comes out. Document Released: 09/11/2003 Document Revised: 06/04/2013 Document Reviewed: 09/14/2012 [facility] Patient Information 2015 [place], [place]. This information is not intended to replace advice given to you by your health care provider. Make sure you discuss any questions you have with your health care provider.

## 2013-10-31 LAB — CBC CANCER CENTER
BASOS PCT: 0.3 %
Basophil #: 0 x10 3/mm (ref 0.0–0.1)
Eosinophil #: 0.1 x10 3/mm (ref 0.0–0.7)
Eosinophil %: 1 %
HCT: 28.9 % — ABNORMAL LOW (ref 40.0–52.0)
HGB: 9.3 g/dL — ABNORMAL LOW (ref 13.0–18.0)
Lymphocyte #: 1.2 x10 3/mm (ref 1.0–3.6)
Lymphocyte %: 9.9 %
MCH: 27.5 pg (ref 26.0–34.0)
MCHC: 32 g/dL (ref 32.0–36.0)
MCV: 86 fL (ref 80–100)
MONOS PCT: 8.3 %
Monocyte #: 1 x10 3/mm (ref 0.2–1.0)
Neutrophil #: 9.4 x10 3/mm — ABNORMAL HIGH (ref 1.4–6.5)
Neutrophil %: 80.5 %
PLATELETS: 528 x10 3/mm — AB (ref 150–440)
RBC: 3.36 10*6/uL — ABNORMAL LOW (ref 4.40–5.90)
RDW: 20.8 % — ABNORMAL HIGH (ref 11.5–14.5)
WBC: 11.7 x10 3/mm — AB (ref 3.8–10.6)

## 2013-10-31 LAB — COMPREHENSIVE METABOLIC PANEL
ALBUMIN: 2.7 g/dL — AB (ref 3.4–5.0)
ALK PHOS: 60 U/L
Anion Gap: 7 (ref 7–16)
BUN: 55 mg/dL — ABNORMAL HIGH (ref 7–18)
Bilirubin,Total: 0.2 mg/dL (ref 0.2–1.0)
CHLORIDE: 101 mmol/L (ref 98–107)
Calcium, Total: 8.8 mg/dL (ref 8.5–10.1)
Co2: 28 mmol/L (ref 21–32)
Creatinine: 5.04 mg/dL — ABNORMAL HIGH (ref 0.60–1.30)
EGFR (Non-African Amer.): 12 — ABNORMAL LOW
GFR CALC AF AMER: 15 — AB
Glucose: 122 mg/dL — ABNORMAL HIGH (ref 65–99)
OSMOLALITY: 288 (ref 275–301)
Potassium: 4.7 mmol/L (ref 3.5–5.1)
SGOT(AST): 23 U/L (ref 15–37)
SGPT (ALT): 13 U/L — ABNORMAL LOW
SODIUM: 136 mmol/L (ref 136–145)
TOTAL PROTEIN: 6.5 g/dL (ref 6.4–8.2)

## 2013-11-01 ENCOUNTER — Ambulatory Visit: Payer: Self-pay | Admitting: Oncology

## 2013-11-07 LAB — BASIC METABOLIC PANEL
Anion Gap: 8 (ref 7–16)
BUN: 36 mg/dL — ABNORMAL HIGH (ref 7–18)
CHLORIDE: 100 mmol/L (ref 98–107)
CO2: 28 mmol/L (ref 21–32)
CREATININE: 3.05 mg/dL — AB (ref 0.60–1.30)
Calcium, Total: 8.6 mg/dL (ref 8.5–10.1)
EGFR (African American): 27 — ABNORMAL LOW
EGFR (Non-African Amer.): 22 — ABNORMAL LOW
Glucose: 138 mg/dL — ABNORMAL HIGH (ref 65–99)
Osmolality: 282 (ref 275–301)
POTASSIUM: 4.2 mmol/L (ref 3.5–5.1)
SODIUM: 136 mmol/L (ref 136–145)

## 2013-11-07 LAB — CBC CANCER CENTER
Basophil #: 0.1 x10 3/mm (ref 0.0–0.1)
Basophil %: 1 %
EOS ABS: 0.2 x10 3/mm (ref 0.0–0.7)
EOS PCT: 2.5 %
HCT: 30.5 % — ABNORMAL LOW (ref 40.0–52.0)
HGB: 9.9 g/dL — ABNORMAL LOW (ref 13.0–18.0)
Lymphocyte #: 0.8 x10 3/mm — ABNORMAL LOW (ref 1.0–3.6)
Lymphocyte %: 10.2 %
MCH: 27.6 pg (ref 26.0–34.0)
MCHC: 32.4 g/dL (ref 32.0–36.0)
MCV: 85 fL (ref 80–100)
Monocyte #: 0.7 x10 3/mm (ref 0.2–1.0)
Monocyte %: 8.7 %
NEUTROS PCT: 77.6 %
Neutrophil #: 6 x10 3/mm (ref 1.4–6.5)
Platelet: 532 x10 3/mm — ABNORMAL HIGH (ref 150–440)
RBC: 3.59 10*6/uL — AB (ref 4.40–5.90)
RDW: 20.2 % — ABNORMAL HIGH (ref 11.5–14.5)
WBC: 7.8 x10 3/mm (ref 3.8–10.6)

## 2013-11-13 ENCOUNTER — Encounter: Payer: Self-pay | Admitting: Family Medicine

## 2013-11-13 ENCOUNTER — Ambulatory Visit (INDEPENDENT_AMBULATORY_CARE_PROVIDER_SITE_OTHER): Payer: Managed Care, Other (non HMO) | Admitting: Family Medicine

## 2013-11-13 VITALS — BP 132/74 | HR 126 | Temp 97.6°F | Wt 170.5 lb

## 2013-11-13 DIAGNOSIS — N289 Disorder of kidney and ureter, unspecified: Secondary | ICD-10-CM

## 2013-11-13 DIAGNOSIS — C61 Malignant neoplasm of prostate: Secondary | ICD-10-CM

## 2013-11-13 DIAGNOSIS — K521 Toxic gastroenteritis and colitis: Secondary | ICD-10-CM

## 2013-11-13 DIAGNOSIS — N5089 Other specified disorders of the male genital organs: Secondary | ICD-10-CM

## 2013-11-13 DIAGNOSIS — N508 Other specified disorders of male genital organs: Secondary | ICD-10-CM

## 2013-11-13 DIAGNOSIS — T451X5A Adverse effect of antineoplastic and immunosuppressive drugs, initial encounter: Secondary | ICD-10-CM

## 2013-11-13 MED ORDER — FUROSEMIDE 20 MG PO TABS: 20.0000 mg | ORAL_TABLET | Freq: Every day | ORAL | Status: AC

## 2013-11-13 NOTE — Progress Notes (Signed)
BP 132/74  Pulse 126  Temp(Src) 97.6 F (36.4 C) (Oral)  Wt 170 lb 8 oz (77.338 kg)   CC: check scrotal area  Subjective:    Patient ID: Francisco Dunn, male    DOB: 04/25/1950, 63 y.o.   MRN: [id number]  HPI: Francisco Dunn is a 63 y.o. male presenting on 11/13/2013 for Hernia   Presents with wife today. 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 Date: 2012 advanced prostate cancer spread to liver and spine, Gleason 5+4, on lupron per Dr. Len Blalock (onc), added casodex, then on abiaterone+prednisone now on cabazitaxel+prednisone (Ottelin)  Recent complicated course. Just underwent bilateral percutaneous nephrostomy tube placement by IR (10/27/2103). Catheter in place.  Last chemo session was Wednesday (Cabazitaxel+prednisone). Dr Jeb Levering has suggested stopping chemo. Feels terrible for 1+ week after every session. Nausea, diarrhea. Spends the night in the bathroom. Multiple trips to bathroom with minimal diarrhea during office visit. Noticed groin swelling this morning. No pain or erythema.  Wt Readings from Last 3 Encounters:  11/13/13 170 lb 8 oz (77.338 kg)  10/30/13 170 lb (77.111 kg)  10/29/13 170 lb 8 oz (77.338 kg)     Diarrhea - no fevers. No blood in stool. Loose and watery. Happens after every chemotherapy session.  Daughters nearby. Lives with wife. Wife works 67 hrs/wk. Yolanda Bonine may be able to stay with pt if needed. Has not looked into hospice, wife looking into palliative care.  Appetite down - basically only eating carnation instant breakfast 1-2 x/day. Lab Results  Component Value Date   HGBA1C 6.8* 09/12/2013    Latest Cr by onc 3s.  Relevant past medical, surgical, family and social history reviewed and updated as indicated.  Allergies and medications reviewed and updated. Current Outpatient Prescriptions on File Prior to Visit  Medication Sig  . aspirin EC 81 MG tablet Take 81 mg by mouth daily.  . cabazitaxel (JEVTANA) 60  MG/1.5ML SOLN Inject 60 mg into the vein every 21 ( twenty-one) days.  . carvedilol (COREG) 6.25 MG tablet Take 1 tablet (6.25 mg total) by mouth 2 (two) times daily.  Marland Kitchen docusate sodium (COLACE) 100 MG capsule Take 100 mg by mouth daily as needed for mild constipation.  . fentaNYL (DURAGESIC - DOSED MCG/HR) 50 MCG/HR Place 50 mcg onto the skin every 3 (three) days.  Marland Kitchen HYDROcodone-acetaminophen (NORCO/VICODIN) 5-325 MG per tablet Take 1-2 tablets by mouth every 6 (six) hours as needed for moderate pain.  . Multiple Vitamin (MULTIVITAMIN) tablet Take 1 tablet by mouth daily.   . ondansetron (ZOFRAN-ODT) 4 MG disintegrating tablet Take 4 mg by mouth every 8 (eight) hours as needed for nausea or vomiting.  Marland Kitchen oxyCODONE-acetaminophen (PERCOCET) 10-325 MG per tablet Take 1-2 tablets by mouth every 4 (four) hours as needed for pain. Maximum dose per 24 hours - 8 pills  . phenazopyridine (PYRIDIUM) 200 MG tablet Take 1 tablet (200 mg total) by mouth 3 (three) times daily as needed for pain.  . predniSONE (DELTASONE) 10 MG tablet Take 10 mg by mouth 2 (two) times daily with a meal.   No current facility-administered medications on file prior to visit.    Review of Systems Per HPI unless specifically indicated above    Objective:    BP 132/74  Pulse 126  Temp(Src) 97.6 F (36.4 C) (Oral)  Wt 170 lb 8 oz (77.338 kg)  Physical Exam  Nursing note and vitals reviewed. Constitutional: He appears well-developed and well-nourished. No distress.  Chachectic, pale, chronically ill  Genitourinary: Right testis shows swelling. Left testis shows swelling.  Marked scrotal swelling with pitting edema without masses  Musculoskeletal: He exhibits edema (1+ bilat pitting edema).  Skin: Skin is warm and dry. No rash noted.   Results for orders placed during the hospital encounter of 10/30/13  APTT      Result Value Ref Range   aPTT 32  24 - 37 seconds  BASIC METABOLIC PANEL      Result Value Ref Range    Sodium 137  137 - 147 mEq/L   Potassium 5.4 (*) 3.7 - 5.3 mEq/L   Chloride 99  96 - 112 mEq/L   CO2 26  19 - 32 mEq/L   Glucose, Bld 218 (*) 70 - 99 mg/dL   BUN 50 (*) 6 - 23 mg/dL   Creatinine, Ser 5.28 (*) 0.50 - 1.35 mg/dL   Calcium 8.7  8.4 - 10.5 mg/dL   GFR calc non Af Amer 10 (*) >90 mL/min   GFR calc Af Amer 12 (*) >90 mL/min   Anion gap 12  5 - 15  CBC      Result Value Ref Range   WBC 5.5  4.0 - 10.5 K/uL   RBC 3.47 (*) 4.22 - 5.81 MIL/uL   Hemoglobin 9.6 (*) 13.0 - 17.0 g/dL   HCT 30.3 (*) 39.0 - 52.0 %   MCV 87.3  78.0 - 100.0 fL   MCH 27.7  26.0 - 34.0 pg   MCHC 31.7  30.0 - 36.0 g/dL   RDW 19.4 (*) 11.5 - 15.5 %   Platelets 442 (*) 150 - 400 K/uL  PROTIME-INR      Result Value Ref Range   Prothrombin Time 13.7  11.6 - 15.2 seconds   INR 1.05  0.00 - 1.49      Assessment & Plan:   Problem List Items Addressed This Visit   Scrotal edema     With pitting pedal edema. Chemotherapy related vs poor nutrition related. Discussed carnation liquid meals as well as elevation of legs. Start lasix 20mg  prn swelling, stop HCTZ - likely ineffective with degree of renal insufficiency. Discussed elevation of legs and scrotum.  Update if not better with this.    Renal insufficiency     ARF s/p perc nephrostomy tubes. Creatinine slowly improving, continue to monitor.    Prostate cancer metastatic to multiple sites - Primary     Pt has taken a turn for the worse - has failed multiple chemotherapies. Now with bilateral perc nephrostomy along with catheter, and worsening scrotal swelling (see below). Discussed with patient option of hospice/palliative care as well as difference between the two. Wife has been looking into hospice/palliative care. Pt will consider this and let me know if desires referral.    Chemotherapy induced diarrhea     Discussed this, recommended imodium prn. Not consistent with infectious diarrhea given this happens temporarily after every  chemotherapeutic agent.        Follow up plan: No Follow-up on file.

## 2013-11-13 NOTE — Patient Instructions (Addendum)
Stop hydrochlorothiazide. Start lasix 20mg  once daily. Keep me updated with how you are doing. Look at protein content of carnation drinks. Would consider glucerna if higher protein content - as this will help with swelling. Keep legs elevated as able, and scrotum elevated as well (towel). For stool urgency - try immodium over the counter. I'd suggest getting palliative care involved in your care. Look into [email or web address] - web-site for hospice and palliative care of [place].

## 2013-11-13 NOTE — Progress Notes (Signed)
Pre visit review using our clinic review tool, if applicable. No additional management support is needed unless otherwise documented below in the visit note. 

## 2013-11-14 DIAGNOSIS — N5089 Other specified disorders of the male genital organs: Secondary | ICD-10-CM | POA: Insufficient documentation

## 2013-11-14 DIAGNOSIS — K521 Toxic gastroenteritis and colitis: Secondary | ICD-10-CM | POA: Insufficient documentation

## 2013-11-14 DIAGNOSIS — T451X5A Adverse effect of antineoplastic and immunosuppressive drugs, initial encounter: Secondary | ICD-10-CM | POA: Insufficient documentation

## 2013-11-14 NOTE — Assessment & Plan Note (Signed)
With pitting pedal edema. Chemotherapy related vs poor nutrition related. Discussed carnation liquid meals as well as elevation of legs. Start lasix 20mg  prn swelling, stop HCTZ - likely ineffective with degree of renal insufficiency. Discussed elevation of legs and scrotum.  Update if not better with this.

## 2013-11-14 NOTE — Assessment & Plan Note (Signed)
ARF s/p perc nephrostomy tubes. Creatinine slowly improving, continue to monitor.

## 2013-11-14 NOTE — Assessment & Plan Note (Signed)
Discussed this, recommended imodium prn. Not consistent with infectious diarrhea given this happens temporarily after every chemotherapeutic agent.

## 2013-11-14 NOTE — Assessment & Plan Note (Signed)
Pt has taken a turn for the worse - has failed multiple chemotherapies. Now with bilateral perc nephrostomy along with catheter, and worsening scrotal swelling (see below). Discussed with patient option of hospice/palliative care as well as difference between the two. Wife has been looking into hospice/palliative care. Pt will consider this and let me know if desires referral.

## 2013-11-16 ENCOUNTER — Inpatient Hospital Stay: Payer: Self-pay | Admitting: Specialist

## 2013-11-16 LAB — URINALYSIS, COMPLETE
BILIRUBIN, UR: NEGATIVE
Bacteria: NONE SEEN
Glucose,UR: NEGATIVE mg/dL (ref 0–75)
KETONE: NEGATIVE
NITRITE: NEGATIVE
Ph: 6 (ref 4.5–8.0)
SPECIFIC GRAVITY: 1.014 (ref 1.003–1.030)
Squamous Epithelial: NONE SEEN

## 2013-11-16 LAB — COMPREHENSIVE METABOLIC PANEL
Albumin: 2.7 g/dL — ABNORMAL LOW (ref 3.4–5.0)
Alkaline Phosphatase: 337 U/L — ABNORMAL HIGH
Anion Gap: 13 (ref 7–16)
BUN: 129 mg/dL — AB (ref 7–18)
Bilirubin,Total: 0.7 mg/dL (ref 0.2–1.0)
CHLORIDE: 94 mmol/L — AB (ref 98–107)
Calcium, Total: 7 mg/dL — CL (ref 8.5–10.1)
Co2: 24 mmol/L (ref 21–32)
Creatinine: 5.36 mg/dL — ABNORMAL HIGH (ref 0.60–1.30)
EGFR (Non-African Amer.): 12 — ABNORMAL LOW
GFR CALC AF AMER: 14 — AB
GLUCOSE: 157 mg/dL — AB (ref 65–99)
Osmolality: 307 (ref 275–301)
Potassium: 6.7 mmol/L (ref 3.5–5.1)
SGOT(AST): 35 U/L (ref 15–37)
SGPT (ALT): 15 U/L
Sodium: 131 mmol/L — ABNORMAL LOW (ref 136–145)
TOTAL PROTEIN: 6 g/dL — AB (ref 6.4–8.2)

## 2013-11-16 LAB — CBC
HCT: 30.8 % — ABNORMAL LOW (ref 40.0–52.0)
HGB: 10 g/dL — ABNORMAL LOW (ref 13.0–18.0)
MCH: 27.9 pg (ref 26.0–34.0)
MCHC: 32.5 g/dL (ref 32.0–36.0)
MCV: 86 fL (ref 80–100)
Platelet: 398 10*3/uL (ref 150–440)
RBC: 3.59 10*6/uL — ABNORMAL LOW (ref 4.40–5.90)
RDW: 20.5 % — ABNORMAL HIGH (ref 11.5–14.5)
WBC: 18.7 10*3/uL — ABNORMAL HIGH (ref 3.8–10.6)

## 2013-11-16 LAB — POTASSIUM: Potassium: 5.9 mmol/L — ABNORMAL HIGH (ref 3.5–5.1)

## 2013-11-16 LAB — LIPASE, BLOOD: Lipase: 27 U/L — ABNORMAL LOW (ref 73–393)

## 2013-11-17 LAB — CBC WITH DIFFERENTIAL/PLATELET
BASOS ABS: 0 10*3/uL (ref 0.0–0.1)
BASOS PCT: 0.1 %
EOS ABS: 0 10*3/uL (ref 0.0–0.7)
Eosinophil %: 0.1 %
HCT: 28.9 % — AB (ref 40.0–52.0)
HGB: 9.3 g/dL — AB (ref 13.0–18.0)
LYMPHS PCT: 3.7 %
Lymphocyte #: 0.7 10*3/uL — ABNORMAL LOW (ref 1.0–3.6)
MCH: 27.6 pg (ref 26.0–34.0)
MCHC: 32.3 g/dL (ref 32.0–36.0)
MCV: 85 fL (ref 80–100)
MONO ABS: 1.5 x10 3/mm — AB (ref 0.2–1.0)
Monocyte %: 8.1 %
NEUTROS PCT: 88 %
Neutrophil #: 16.8 10*3/uL — ABNORMAL HIGH (ref 1.4–6.5)
Platelet: 349 10*3/uL (ref 150–440)
RBC: 3.38 10*6/uL — ABNORMAL LOW (ref 4.40–5.90)
RDW: 20.5 % — ABNORMAL HIGH (ref 11.5–14.5)
WBC: 19.1 10*3/uL — ABNORMAL HIGH (ref 3.8–10.6)

## 2013-11-17 LAB — BASIC METABOLIC PANEL
Anion Gap: 15 (ref 7–16)
BUN: 119 mg/dL — ABNORMAL HIGH (ref 7–18)
Calcium, Total: 6.9 mg/dL — CL (ref 8.5–10.1)
Chloride: 97 mmol/L — ABNORMAL LOW (ref 98–107)
Co2: 20 mmol/L — ABNORMAL LOW (ref 21–32)
Creatinine: 4.9 mg/dL — ABNORMAL HIGH (ref 0.60–1.30)
EGFR (African American): 16 — ABNORMAL LOW
EGFR (Non-African Amer.): 13 — ABNORMAL LOW
GLUCOSE: 86 mg/dL (ref 65–99)
Osmolality: 302 (ref 275–301)
POTASSIUM: 5.5 mmol/L — AB (ref 3.5–5.1)
SODIUM: 132 mmol/L — AB (ref 136–145)

## 2013-11-17 LAB — CLOSTRIDIUM DIFFICILE(ARMC)

## 2013-11-17 LAB — ALBUMIN: Albumin: 2.5 g/dL — ABNORMAL LOW (ref 3.4–5.0)

## 2013-11-18 LAB — CBC WITH DIFFERENTIAL/PLATELET
Basophil #: 0 10*3/uL (ref 0.0–0.1)
Basophil %: 0.1 %
EOS PCT: 0.4 %
Eosinophil #: 0 10*3/uL (ref 0.0–0.7)
HCT: 26.6 % — ABNORMAL LOW (ref 40.0–52.0)
HGB: 8.3 g/dL — AB (ref 13.0–18.0)
LYMPHS ABS: 0.5 10*3/uL — AB (ref 1.0–3.6)
Lymphocyte %: 4 %
MCH: 27 pg (ref 26.0–34.0)
MCHC: 31.2 g/dL — AB (ref 32.0–36.0)
MCV: 86 fL (ref 80–100)
Monocyte #: 1 x10 3/mm (ref 0.2–1.0)
Monocyte %: 8.2 %
NEUTROS PCT: 87.3 %
Neutrophil #: 10.5 10*3/uL — ABNORMAL HIGH (ref 1.4–6.5)
PLATELETS: 284 10*3/uL (ref 150–440)
RBC: 3.08 10*6/uL — ABNORMAL LOW (ref 4.40–5.90)
RDW: 20.8 % — AB (ref 11.5–14.5)
WBC: 12 10*3/uL — ABNORMAL HIGH (ref 3.8–10.6)

## 2013-11-18 LAB — BASIC METABOLIC PANEL
Anion Gap: 14 (ref 7–16)
BUN: 101 mg/dL — AB (ref 7–18)
CHLORIDE: 98 mmol/L (ref 98–107)
CREATININE: 4.49 mg/dL — AB (ref 0.60–1.30)
Calcium, Total: 6.6 mg/dL — CL (ref 8.5–10.1)
Co2: 20 mmol/L — ABNORMAL LOW (ref 21–32)
EGFR (African American): 17 — ABNORMAL LOW
EGFR (Non-African Amer.): 14 — ABNORMAL LOW
GLUCOSE: 154 mg/dL — AB (ref 65–99)
OSMOLALITY: 299 (ref 275–301)
Potassium: 3.9 mmol/L (ref 3.5–5.1)
SODIUM: 132 mmol/L — AB (ref 136–145)

## 2013-11-18 LAB — OCCULT BLOOD X 1 CARD TO LAB, STOOL: OCCULT BLOOD, FECES: POSITIVE

## 2013-11-19 LAB — BASIC METABOLIC PANEL
Anion Gap: 14 (ref 7–16)
BUN: 75 mg/dL — AB (ref 7–18)
CHLORIDE: 104 mmol/L (ref 98–107)
CO2: 18 mmol/L — AB (ref 21–32)
Calcium, Total: 6.6 mg/dL — CL (ref 8.5–10.1)
Creatinine: 4.32 mg/dL — ABNORMAL HIGH (ref 0.60–1.30)
EGFR (African American): 18 — ABNORMAL LOW
EGFR (Non-African Amer.): 15 — ABNORMAL LOW
Glucose: 171 mg/dL — ABNORMAL HIGH (ref 65–99)
Osmolality: 298 (ref 275–301)
Potassium: 3.2 mmol/L — ABNORMAL LOW (ref 3.5–5.1)
Sodium: 136 mmol/L (ref 136–145)

## 2013-11-19 LAB — STOOL CULTURE

## 2013-11-19 LAB — HEMOGLOBIN: HGB: 8.5 g/dL — ABNORMAL LOW (ref 13.0–18.0)

## 2013-11-19 LAB — WBCS, STOOL

## 2013-11-19 LAB — ALBUMIN: Albumin: 2.3 g/dL — ABNORMAL LOW (ref 3.4–5.0)

## 2013-11-20 LAB — BASIC METABOLIC PANEL
ANION GAP: 12 (ref 7–16)
BUN: 71 mg/dL — ABNORMAL HIGH (ref 7–18)
Calcium, Total: 6.8 mg/dL — CL (ref 8.5–10.1)
Chloride: 106 mmol/L (ref 98–107)
Co2: 18 mmol/L — ABNORMAL LOW (ref 21–32)
Creatinine: 3.94 mg/dL — ABNORMAL HIGH (ref 0.60–1.30)
EGFR (African American): 20 — ABNORMAL LOW
EGFR (Non-African Amer.): 16 — ABNORMAL LOW
Glucose: 219 mg/dL — ABNORMAL HIGH (ref 65–99)
Osmolality: 299 (ref 275–301)
Potassium: 2.9 mmol/L — ABNORMAL LOW (ref 3.5–5.1)
Sodium: 136 mmol/L (ref 136–145)

## 2013-12-02 ENCOUNTER — Ambulatory Visit: Payer: Self-pay | Admitting: Oncology

## 2013-12-03 ENCOUNTER — Encounter (HOSPITAL_BASED_OUTPATIENT_CLINIC_OR_DEPARTMENT_OTHER): Admission: RE | Payer: Self-pay | Source: Ambulatory Visit

## 2013-12-03 ENCOUNTER — Ambulatory Visit (HOSPITAL_BASED_OUTPATIENT_CLINIC_OR_DEPARTMENT_OTHER): Admission: RE | Admit: 2013-12-03 | Payer: Managed Care, Other (non HMO) | Source: Ambulatory Visit | Admitting: Urology

## 2013-12-03 SURGERY — CYSTOSCOPY, FLEXIBLE, WITH STENT REPLACEMENT
Anesthesia: General | Laterality: Bilateral

## 2013-12-05 ENCOUNTER — Telehealth: Payer: Self-pay | Admitting: *Deleted

## 2013-12-10 NOTE — Telephone Encounter (Signed)
spoke with wife and expressed my condolences.

## 2014-01-01 NOTE — Telephone Encounter (Signed)
Mindy from [facility] called to let you know that patient passed today at 11:50am.

## 2014-01-01 DEATH — deceased

## 2014-03-15 ENCOUNTER — Ambulatory Visit: Payer: Managed Care, Other (non HMO) | Admitting: Family Medicine

## 2014-05-25 NOTE — Consult Note (Signed)
CT Dye: Blurred Vision, Dizzy/Fainting, N/V/Diarrhea  Lisinopril: Hypertension  Nursing Flowsheets: **Vital Signs.:   19-Oct-15 04:45  Pulse Pulse 72    13:25  Vital Signs Type Routine  Temperature Temperature (F) 97.6  Celsius 36.4  Pulse Pulse 78  Respirations Respirations 20  Systolic BP Systolic BP 99  Diastolic BP (mmHg) Diastolic BP (mmHg) 58  Mean BP 71  Pulse Ox % Pulse Ox % 97  Pulse Ox Activity Level  At rest  Oxygen Delivery Room Air/ 21 %    General Aspect Chief Complaint/Diagnosis:  1.  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 2. Started on Lupron, January 2013 3. Progressive disease with hydronephrosis and rising PSA.  Patient was admitted in the hospital on September 3 had a stent placement and was started on Casodex.  According to data available  his  PSA was 25 during last hospital stay 4. Lupron plus Casodex.  September, 2013 5. Lupron and Casodex was discontinued and was started on Zytiga and  prednisone 6. Because of rising PSA would start patient on the xtandi (May, 2014) 7. Progressing disease by tumor markers (PSA) in Gen. condition 8. Progressing disease by tumor markersn ultrasound started on Taxotere  November, 2014 9.patient has been started on  cabazitaxel September of 2015 9. Progressing disease by tumor markers July of 2015 10. 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  Received cabazitaxel chemotherapy on November 07 2013 followed by Neulasta injection.  Was admitted in the hospital with severe diarrhea and dehydration progressive renal failure or.  Patient had a renal failure secondary to bilateral obstruction due to enlarged lymph node.  Feeling extremely weak tired.  No chills no fever Stool is negative for C. difficile. ??  Hypertension:  ??  Prostate Cancer:    Preventive Screening: ?? Has patient had any of the following test? Colonscopy  Prostate Exam (1) ?? Last Colonoscopy: Never(1) ?? Last Prostate Exam: November 2012(1)  Smoking History: Smoking History 2-3(1)Cigarettes per day(1)Smoking Cessation Information Given to Patient (1)  PFSH: Comments: No family history of colorectal cancer, breast cancer, or ovarian cancer. Comments: did  smoke but quit smoking several years ago: Drinks beer  Works as a Scientist, research (medical) Past Medical and Surgical History: hypertension   Case History and Physical Exam:  Chief Complaint Abdominal Pain  Nausea/Vomiting  poor appetite   Past Medical Health Hypertension, Cancer   Primary Care Provider None   Family History Non-Contributory   HEENT PERLA   Neck/Nodes No Adenopathy   Chest/Lungs Clear   Breasts Not examined   Cardiovascular tachycardia   Abdomen Rebound tenderness  Active bowel sounds   Genitalia Not examined   Rectal Not examined   Musculoskeletal Full range of motion   Neurological Grossly WNL   Skin Warm    Impression diarrhea Secondary to chemotherapy vs. malignancy.  Start patient on Sandostatin discontinue Sandostatin if diarrhea resolves for 24 hours. 2.  Carcinoma of prostate castration resistant prostate cancer progressing Disease on multiple chemotherapy programs. Overall poor prognosis Patient and family lady for palliative care and hospice care. They are in agreement about could not resuscitation procedure Will start patient on Megace to help with appetite Sandostatin for diarrhea Ambulate patient if patient can be taken care than can be discharged home with hospice.  He patient's general condition does not improve then patient can  be transferred to hospice home.3.  Aggressive renal failure or secondary to obstructive uropathy due to malignancy not responding to bilateral percutaneous nephrostomy placement   Electronic Signatures: Francisco Dunn (MD)  (Signed 19-Oct-15 20:15)  Authored: Allergies, Vital Signs, General Aspect/Present Illness, History and Physical Exam, Impression/Plan   Last Updated: 19-Oct-15 20:15 by Francisco Dunn (MD)

## 2014-05-25 NOTE — H&P (Signed)
PATIENT NAME:  Francisco Dunn, CABINESS MR#:  [id number] DATE OF BIRTH:  07-Nov-1950  DATE OF ADMISSION:  11/16/2013  PRIMARY CARE PHYSICIAN:  Dr. Danise Mina.   ONCOLOGIST:  Dr. Oliva Bustard.   CHIEF COMPLAINT:  Diarrhea, nausea, vomiting.   HISTORY OF PRESENT ILLNESS:  This is a 64 year old male who presents to the hospital due to ongoing diarrhea, nausea, vomiting for the past week or so. The patient has a history of advanced prostate cancer and is currently undergoing chemotherapy. Since his chemotherapy about a week or so ago, the patient has been having significant diarrhea, nausea, vomiting, and having a hard time keeping anything down. It has gotten progressively worse and he had gotten so weak, therefore, he came to the ER for further evaluation. In the Emergency Room, the patient continues to have diarrhea. He was noted to be in severe acute renal failure with hyperkalemia and noted to be acutely dehydrated. Hospitalist services were contacted for further treatment and evaluation. The patient denies any significant abdominal pain, any fevers, chills, any chest pain, shortness of breath, or any other associated symptoms presently.   REVIEW OF SYSTEMS:  CONSTITUTIONAL:  No documented fever. Positive fatigue and weakness. No weight gain.  EYES:  No blurry or double vision.  ENT:  No tinnitus. No postnasal drip. No redness of oropharynx.  RESPIRATORY:  No cough, no wheeze, no hemoptysis, no dyspnea.  CARDIOVASCULAR:  No chest pain. No orthopnea. No palpitations. No syncope.  GASTROINTESTINAL:  Positive nausea. Positive vomiting. Positive ongoing diarrhea. No abdominal pain. No melena or hematochezia.  GENITOURINARY:  No dysuria or hematuria.  ENDOCRINE:  No polyuria or nocturia. No heat or cold intolerance.  HEMATOLOGIC:  No anemia, no bruising, no bleeding.  INTEGUMENTARY:  No rashes. No lesions.  MUSCULOSKELETAL:  No arthritis. No swelling. No gout.  NEUROLOGIC:  No numbness or tingling. No ataxia. No  seizure activity.  PSYCHIATRIC: No anxiety, no insomnia, no ADD.   PAST MEDICAL HISTORY: Consistent with advanced prostate cancer, status post bilateral nephrostomy tubes, chronic pain, hypertension.   ALLERGIES:  CT DYE AND LISINOPRIL.   SOCIAL HISTORY:  No smoking. No alcohol abuse. No illicit drug abuse. Lives by himself.   FAMILY HISTORY:  The patient's mother and father are both alive. Diabetes runs in the family, cancer runs in the family, breast and prostate cancer.   CURRENT MEDICATIONS:  Tylenol with oxycodone 5/325 mg 1 tab q. 4 hours as needed, aspirin 81 mg daily, Lomotil 1 tablet q.i.d. as needed for diarrhea, Coreg 12.5 mg b.i.d., Duragesic patch 50 mcg every 72 hours, multivitamin daily, Zofran 4 mg q. 6 hours as needed, prednisone 20 mg daily, Reglan 10 mg t.i.d. before meals, vitamin B6 25 mg daily, vitamin C 500 mg daily, Zofran 8 mg disintegrating tablet 1 tab b.i.d. as needed for nausea/vomiting.   PHYSICAL EXAMINATION:  Presently is as follows:  VITAL SIGNS:  Temperature is 98.3, pulse 83, respirations 16, blood pressure 133/77,  saturation is 98% on room air.  GENERAL:  He is an emaciated-appearing male in mild distress.  HEENT:  Atraumatic, normocephalic. Extraocular muscles are intact. Pupils equal and reactive to light. Sclerae are anicteric. No conjunctival injection. No oropharyngeal erythema. Dry oral mucosa.  NECK:  Supple. There is no jugular venous distention. No bruits or lymphadenopathy. No thyromegaly.  HEART:  Regular rate and rhythm, tachycardic. No murmurs, no rubs, no clicks.  LUNGS:  Clear to auscultation bilaterally. No rales, no rhonchi, no wheezes.  ABDOMEN:  Soft, flat,  nontender, nondistended. He has good bowel sounds. No hepatosplenomegaly appreciated.  EXTREMITIES:  No evidence of any cyanosis, clubbing, or peripheral edema. He has +2 pedal and radial pulses bilaterally.  NEUROLOGICAL:  The patient is alert, awake, oriented x 3 with no focal motor  or sensory deficits appreciated bilaterally.  SKIN:  Moist and warm with no rashes appreciated.  LYMPHATIC:  There is no cervical or axillary lymphadenopathy.   LABORATORY DATA:  Serum glucose of 157, BUN 129, creatinine 5.3, sodium 131, potassium 6.7, chloride 94, bicarbonate 24, albumin is 2.7. White cell count 18.7, hemoglobin 10, hematocrit 30.8, platelet count of 398,000. Urinalysis shows 3+ leukocyte esterase with 58 white cells.   ASSESSMENT AND PLAN:  A 64 year old male with a history of advanced prostate cancer, status post bilateral nephrostomy tubes, chronic pain, hypertension, who presents to the hospital due to ongoing diarrhea for the past few days, noted to be in acute on chronic renal failure with acute hyperkalemia.   1.  Acute on chronic renal failure. This is likely secondary to severe dehydration and ongoing diarrhea, nausea, vomiting. Baseline creatinine is around between 2 to 3, now up to 5.3. I will aggressively hydrate the patient with intravenous fluids, follow BUN and creatinine, hold his Lasix for now. We will get a nephrology consult.  2.  Hyperkalemia. This is likely secondary to the acute renal failure. I cannot give the patient Kayexalate due to his severe diarrhea. I will give insulin D50, calcium gluconate, and follow his potassium closely. EKG has been reviewed, which showed no acute T wave peaking or any acute ST changes consistent with hyperkalemia,  3.  Acute diarrhea. The exact etiology of this is unclear. I suspect it is probably related to his chemotherapy that he was getting for his prostate cancer, although I will check a stool for Clostridium difficile, comprehensive culture, ova and parasites. If the Clostridium difficile is negative, I would consider starting the patient on scheduled Imodium and also Lomotil. Consider gastrointestinal consult if not improving.  4.  Chronic pain. Continue fentanyl patch.  5.  Advanced prostate cancer. The patient follows with  Dr. Oliva Bustard. Would consult oncology if needed.  6.  Hypertension. Hold his blood pressure medications given his relative hypotension.   CODE STATUS:  The patient is a FULL CODE.   TIME SPENT:  50 minutes    ____________________________ Belia Heman. Verdell Carmine, MD vjs:ts D: 11/16/2013 20:59:00 ET T: 11/16/2013 21:26:53 ET JOB#: [id number]  cc: Belia Heman. Verdell Carmine, MD, <Dictator> Henreitta Leber MD ELECTRONICALLY SIGNED 12/10/2013 11:00

## 2014-05-25 NOTE — Discharge Summary (Signed)
PATIENT NAME:  Francisco Dunn, Francisco Dunn MR#:  [id number] DATE OF BIRTH:  25-Jan-1951  DATE OF ADMISSION:  11/16/2013 DATE OF DISCHARGE:  11/20/2013  PRIMARY CARE PHYSICIAN:   Ria Bush, MD   ONCOLOGIST:  Forest Gleason, MD    FINAL DIAGNOSES:  1.  Acute on chronic renal failure.  2.  Initial hyperkalemia, then hypokalemia.  3.  Acute diarrhea, nausea, vomiting, leukocytosis,  4.  Anemia of chronic disease.  5.  Chronic Foley and nephrostomy tubes.  6.  Stage IV prostate cancer unresponsive to treatment.  7.  Hypotension.  8.  Chronic pain.  9.  Severe malnutrition   MEDICATIONS ON DISCHARGE TO THE HOSPICE HOME:  Include: Zofran ODT 1 tablet twice a day as needed for nausea and vomiting, fentanyl 50 mcg topically every 3 days, acetaminophen hydrocodone 325/5 at 1 tablet every 4 to 6 hours as needed for moderate pain, Megace 40 mg/mL, 10 mL twice a day, potassium chloride 20 mEq 2 tablets twice a day, magnesium oxide 440 mg 1 tablet twice a day, baclofen 10 mg 3 times a day, Ensure clear 400 mg 100 mL 3 times a day with meals, loperamide 2 mg 1 capsule every 4 hours, belladonna opium 16.2-30 mg rectal suppository every 12 hours as needed for diarrhea.   HOSPITAL COURSE:  The patient was admitted 11/16/2013 and discharged 11/20/2013. The patient came in with diarrhea, nausea, vomiting and was admitted with acute on chronic renal failure, dehydration secondary to diarrhea, nausea, vomiting, unable to keep anything down. Initially had hyperkalemia.   LABORATORY AND RADIOLOGICAL DATA DURING THE HOSPITAL COURSE: Included a glucose of 157. BUN 129, creatinine 5.36, sodium 131, potassium 6.7, chloride 94, CO2 of 24, calcium 7.0, alkaline phosphatase 337, ALT 15, AST 35, total protein 6, albumin 2.7. Lipase 27. White blood cell count 18.7, hemoglobin and hematocrit 10.0 and 30.8, platelet count of 398,000. Urinalysis 3+ leukocyte esterase, 1+ blood, looks like that was from a chronic Foley. Stool culture  negative.  EKG: Sinus rhythm, premature atrial complexes, albumin 2.5. Repeat potassium 5.9 on 10/17.  Repeat potassium 5.5, creatinine 4.9. Stool for C. difficile was negative.   CT scan of the abdomen and pelvis without contrast showed marked progression of extensive lymphadenopathy in the abdomen and pelvis, compatible with metastatic prostate carcinoma, new serosal scratch on the serosal implant off the posterior right hepatic lobe and retroperitoneal, most recent chemistry included a glucose of 219 BUN 71, creatinine 3.94. Sodium 135, potassium 2.9, chloride 106, CO2 of 18, calcium 6.8.  Last hemoglobin was 8.5.   HOSPITAL COURSE PER PROBLEM:  1.  For the patient's acute on chronic renal failure, the patient was given IV fluids during the entire hospital course.  Part of the issue here is he is only drinking apple juice and Ensure clear, not wanting to eat anything.  He does have chronic kidney disease, recently had nephrostomy tubes placed and he has a chronic Foley. There is no hydronephrosis seen so I think this is all dehydration.  2.  Initial, hyperkalemia secondary to the acute renal failure, which was treated, now the patient has hypokalemia and has been given potassium supplementation.  3.  Acute diarrhea, nausea, vomiting, and leukocytosis.  Stool studies were negative. I empirically did start antibiotics, but I stopped them.  This is likely a side effect from the new chemotherapy.  Sandostatin IM was given by Dr. Oliva Bustard, which sometimes helps out with acute diarrhea from chemotherapy.  4.   Anemia of chronic  disease. I did start iron here in the hospital but going to the  hospice home, will stop that.  5.  Chronic Foley and nephrostomy tubes, care at the facility.  6.  Stage IV prostate cancer, metastatic in the abdomen, failure thrive and severe nutrition with chronic pain. Dr. Oliva Bustard has no plans for further chemotherapy. Overall prognosis is poor. The patient was made a DNR during the  hospital course.  Since the wife works and unable to take care of him at home, the best option is the hospice home at this point.  7.  For the patient's hypotension, I am holding Coreg.  8.  For the chronic pain on fentanyl.  9.  For the severe malnutrition, the patient does not want to eat. He is only drinking Ensure clear and apple juice    TIME SPENT ON DISCHARGE: 35 minutes. 'The patient will be going to the hospice home 11/20/2013.     ____________________________ Tana Conch. Leslye Peer, MD rjw:DT D: 11/20/2013 13:38:10 ET T: 11/20/2013 14:32:17 ET JOB#: [id number]  cc: Tana Conch. Leslye Peer, MD, <Dictator> Ria Bush, MD Martie Lee. Oliva Bustard, MD  Marisue Brooklyn MD ELECTRONICALLY SIGNED 11/22/2013 13:24

## 2015-03-22 IMAGING — CT CT ABD-PELV W/O CM
2 of 4 series · 15 of 46 positions shown, 17 images · non-contrast
Comparison: PET CT scan the 02/26/2013. CT abdomen and pelvis
05/12/2012.

CLINICAL DATA: History of prostate cancer with bilateral
hydronephrosis. Status post nephrostomy tube placement. Nausea,
vomiting and diarrhea.

EXAM:
CT ABDOMEN AND PELVIS WITHOUT CONTRAST
TECHNIQUE: Multidetector CT imaging of the abdomen and pelvis was performed
following the standard protocol without IV contrast.

[Series 2: routine abd pel without · axial · non-contrast · 0.68mm/px · z∈[-1168,-774]mm · 12 of 91 slices shown, 14 images]
[im 8/91  soft-tissue]
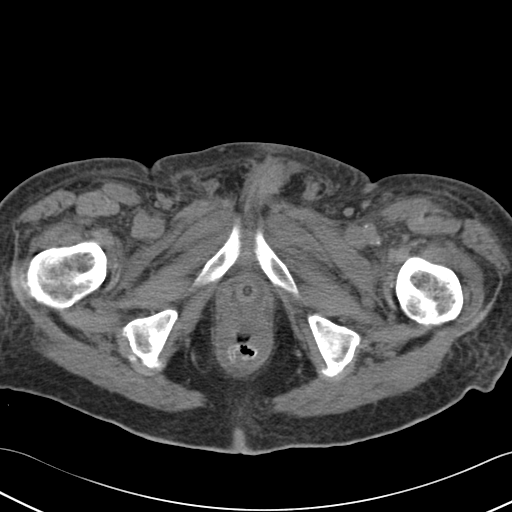
[im 8/91  bone]
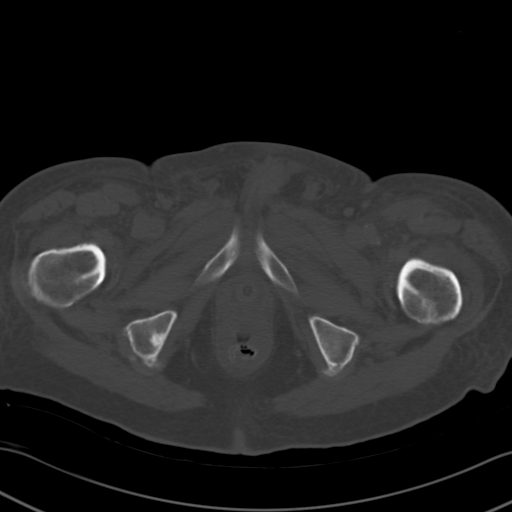
[im 15/91  soft-tissue]
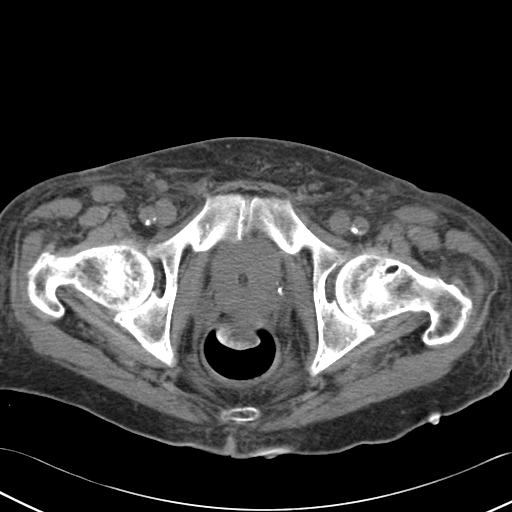
[im 22/91  soft-tissue]
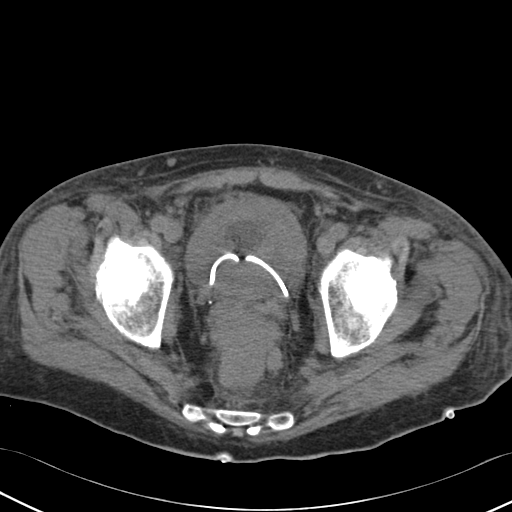
[im 29/91  soft-tissue]
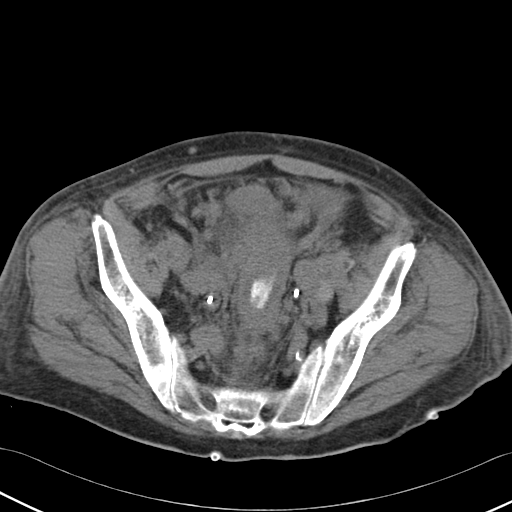
[im 37/91  soft-tissue]
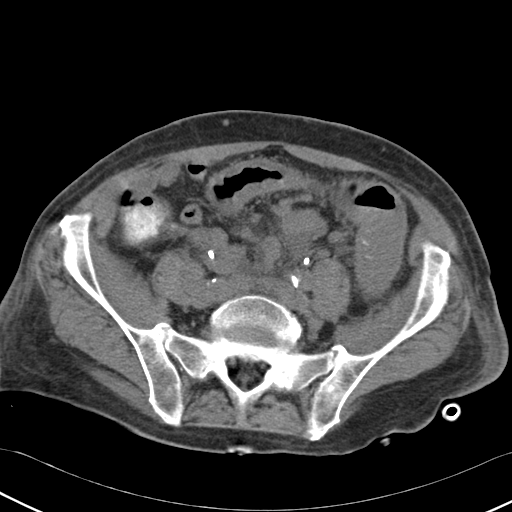
[im 44/91  soft-tissue]
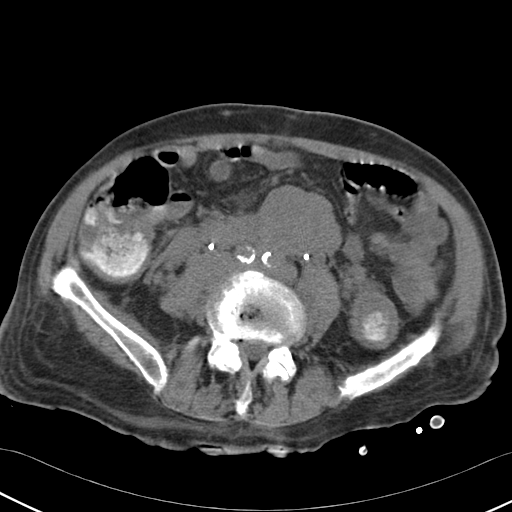
[im 51/91  soft-tissue]
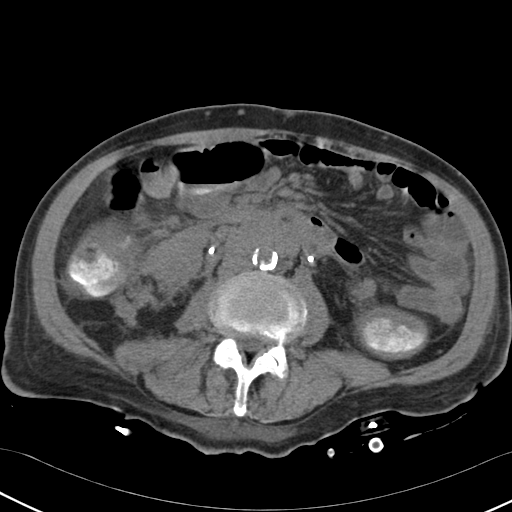
[im 58/91  soft-tissue]
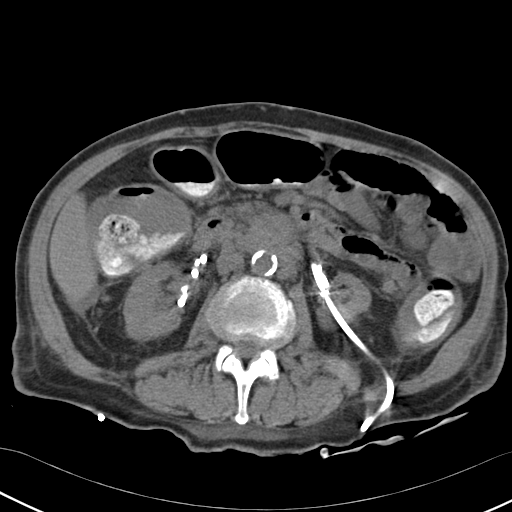
[im 65/91  soft-tissue]
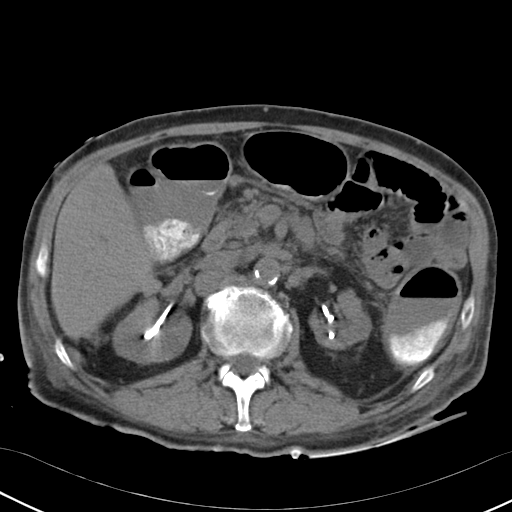
[im 65/91  bone]
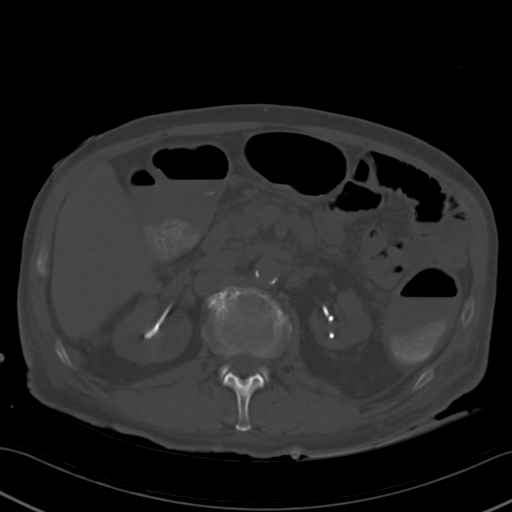
[im 73/91  soft-tissue]
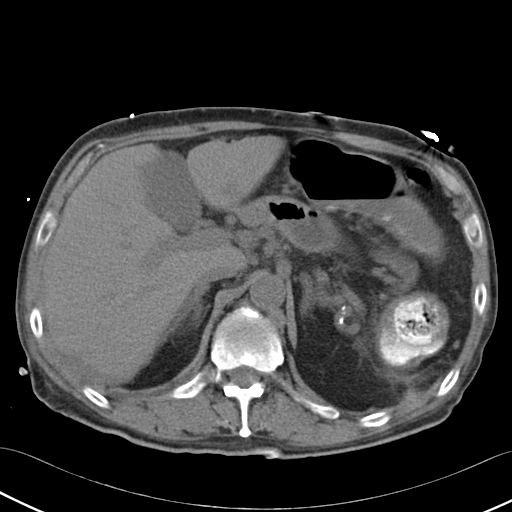
[im 80/91  soft-tissue]
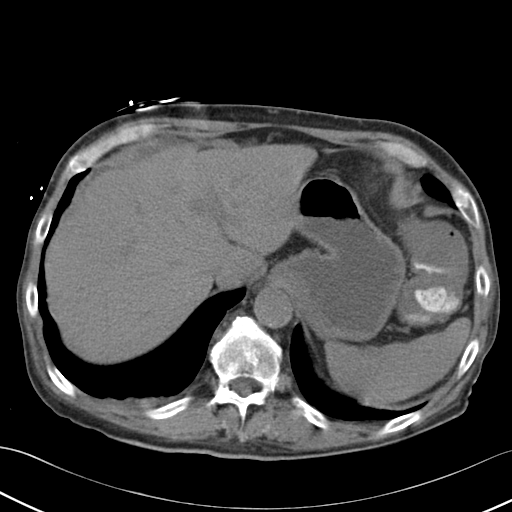
[im 87/91  soft-tissue]
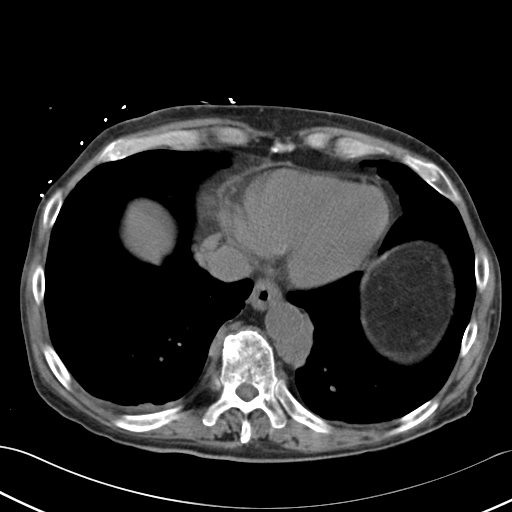

[Series 5: cor routine abd pel wo · coronal · 0.72mm/px · 3 of 116 slices shown]
[im 39/116  soft-tissue]
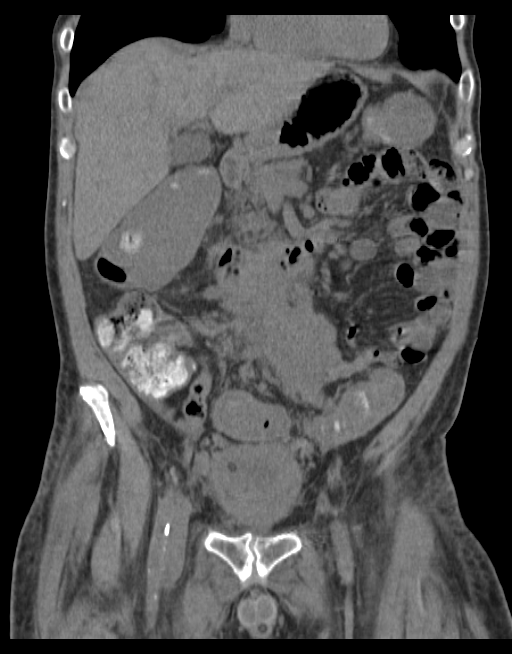
[im 52/116  soft-tissue]
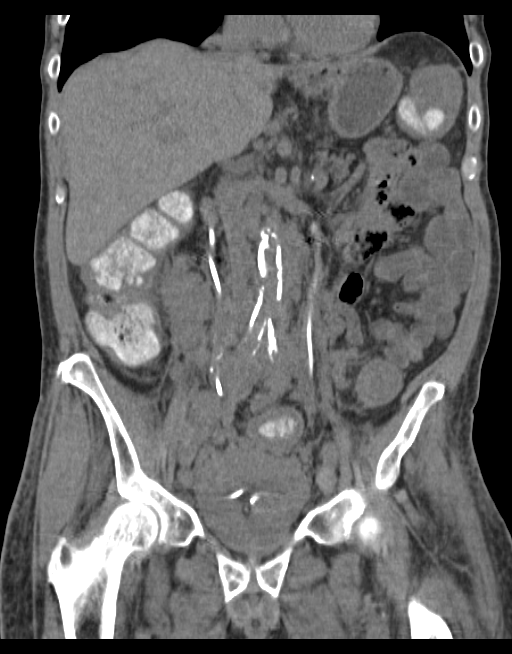
[im 64/116  soft-tissue]
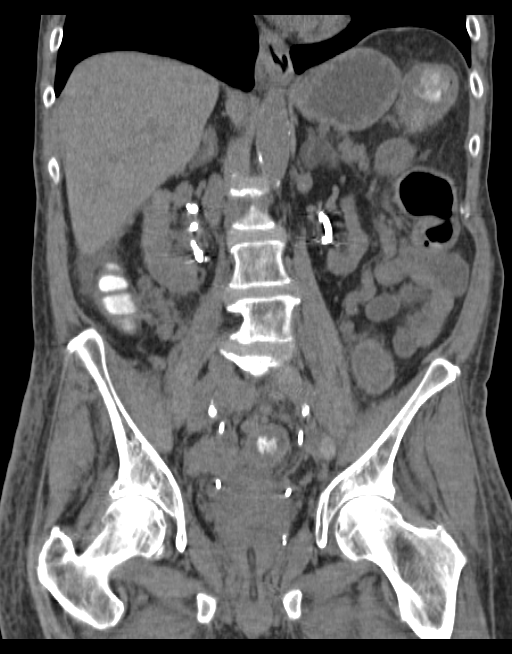

[15 of 46 positions shown; findings below may reference images not displayed]

FINDINGS: Trace pleural effusions are seen. No pericardial effusion. Lung
bases are clear. Masslike lesion along the descending thoracic aorta
on the lateral side measuring 3.1 x 2.0 cm on image and is likely
due to lymphadenopathy. The abnormality is seen on the prior PET
scan and has worsened.

Bilateral nephrostomy tubes are in place with bilateral double-J
ureteral stents also identified. There is no hydronephrosis. The
gallbladder, spleen, adrenal glands and pancreas appear normal.

No intraparenchymal liver lesion is identified. There is a new
serosal implant along the posterior right hepatic lobe measuring
x 3.9 cm on image 21. Second tumor implant off the inferior pole of
the right kidney measures approximately 5.0 cm craniocaudal by
cm AP x 3.5 cm transverse. This lesion is not present on the prior
exam.

Retroperitoneal lymphadenopathy has markedly worsened since the
prior examination. There is a nodal mass centered anterior to the
left common iliac artery measuring 5.9 x 4.8 cm on image 48. There
is a new nodal mass anterior to the aorta at the level of the lower
pole left kidney measuring 5.0 cm transverse by 2.4 cm AP on image
33. Previously seen node anterior to the sacrum which had measured
1.7 cm transverse by a 2.1 cm AP today measures 2.2 cm transverse by
2.5 cm AP on image 58. Left external iliac lymph node which had
measured 1.1 cm short axis dimension today measures 1.9 cm on image
63.

Enlargement of the prostate gland has worsened. The walls of the
urinary bladder are markedly thickened and the bladder is
decompressed with a Foley catheter in place. The prostate gland
appears to invade the bladder base. A very small amount of scattered
abdominal ascites is identified. The stomach and small and large
bowel are unremarkable. No worrisome bony lesion is identified.
IMPRESSION: Marked progression of extensive lymphadenopathy in the abdomen and
pelvis compatible with metastatic prostate carcinoma. New serosal
scratch the serosal implant off along the posterior right hepatic
lobe and retroperitoneal and plan inferior to the right kidney are
also not seen in appear new since the most recent study.

Negative for hydronephrosis with bilateral double-J ureteral stents
in place.
